# Patient Record
Sex: Female | Born: 1965 | Race: White | Hispanic: No | Smoking: Current every day smoker
Health system: Southern US, Community
[De-identification: ages and names within clinical notes are randomized; demographics above are authoritative.]

## PROBLEM LIST (undated history)

## (undated) DIAGNOSIS — F1010 Alcohol abuse, uncomplicated: Secondary | ICD-10-CM

## (undated) DIAGNOSIS — M5430 Sciatica, unspecified side: Secondary | ICD-10-CM

## (undated) HISTORY — PX: OTHER SURGICAL HISTORY: SHX169

---

## 2000-11-17 ENCOUNTER — Other Ambulatory Visit: Admission: RE | Admit: 2000-11-17 | Discharge: 2000-11-17 | Payer: Self-pay | Admitting: *Deleted

## 2000-11-17 ENCOUNTER — Other Ambulatory Visit: Admission: RE | Admit: 2000-11-17 | Discharge: 2000-11-17 | Payer: Self-pay | Admitting: Family Medicine

## 2000-11-20 ENCOUNTER — Ambulatory Visit (HOSPITAL_COMMUNITY): Admission: RE | Admit: 2000-11-20 | Discharge: 2000-11-20 | Payer: Self-pay | Admitting: Family Medicine

## 2000-11-20 ENCOUNTER — Encounter: Payer: Self-pay | Admitting: Family Medicine

## 2001-01-18 ENCOUNTER — Encounter: Payer: Self-pay | Admitting: Family Medicine

## 2001-01-18 ENCOUNTER — Ambulatory Visit (HOSPITAL_COMMUNITY): Admission: RE | Admit: 2001-01-18 | Discharge: 2001-01-18 | Payer: Self-pay | Admitting: Family Medicine

## 2001-03-15 ENCOUNTER — Inpatient Hospital Stay (HOSPITAL_COMMUNITY): Admission: AD | Admit: 2001-03-15 | Discharge: 2001-03-18 | Payer: Self-pay | Admitting: Family Medicine

## 2001-03-15 ENCOUNTER — Encounter: Payer: Self-pay | Admitting: Family Medicine

## 2001-03-15 ENCOUNTER — Encounter (HOSPITAL_COMMUNITY): Admission: RE | Admit: 2001-03-15 | Discharge: 2001-03-16 | Payer: Self-pay | Admitting: Family Medicine

## 2015-09-10 ENCOUNTER — Encounter (HOSPITAL_COMMUNITY): Payer: Self-pay | Admitting: Emergency Medicine

## 2015-09-10 ENCOUNTER — Inpatient Hospital Stay (HOSPITAL_COMMUNITY)
Admission: EM | Admit: 2015-09-10 | Discharge: 2015-09-14 | DRG: 441 | Disposition: A | Discharge status: Home / Self Care | Payer: Self-pay | Attending: Internal Medicine | Admitting: Internal Medicine

## 2015-09-10 ENCOUNTER — Emergency Department (HOSPITAL_COMMUNITY): Payer: Self-pay

## 2015-09-10 DIAGNOSIS — D539 Nutritional anemia, unspecified: Secondary | ICD-10-CM | POA: Diagnosis present

## 2015-09-10 DIAGNOSIS — K7200 Acute and subacute hepatic failure without coma: Principal | ICD-10-CM | POA: Diagnosis present

## 2015-09-10 DIAGNOSIS — E875 Hyperkalemia: Secondary | ICD-10-CM | POA: Diagnosis present

## 2015-09-10 DIAGNOSIS — F1010 Alcohol abuse, uncomplicated: Secondary | ICD-10-CM

## 2015-09-10 DIAGNOSIS — R52 Pain, unspecified: Secondary | ICD-10-CM

## 2015-09-10 DIAGNOSIS — E876 Hypokalemia: Secondary | ICD-10-CM | POA: Diagnosis present

## 2015-09-10 DIAGNOSIS — D638 Anemia in other chronic diseases classified elsewhere: Secondary | ICD-10-CM | POA: Diagnosis present

## 2015-09-10 DIAGNOSIS — R1909 Other intra-abdominal and pelvic swelling, mass and lump: Secondary | ICD-10-CM

## 2015-09-10 DIAGNOSIS — K769 Liver disease, unspecified: Secondary | ICD-10-CM

## 2015-09-10 DIAGNOSIS — F17210 Nicotine dependence, cigarettes, uncomplicated: Secondary | ICD-10-CM | POA: Diagnosis present

## 2015-09-10 DIAGNOSIS — E871 Hypo-osmolality and hyponatremia: Secondary | ICD-10-CM | POA: Diagnosis present

## 2015-09-10 DIAGNOSIS — K7011 Alcoholic hepatitis with ascites: Secondary | ICD-10-CM | POA: Diagnosis present

## 2015-09-10 DIAGNOSIS — K7460 Unspecified cirrhosis of liver: Secondary | ICD-10-CM | POA: Diagnosis present

## 2015-09-10 DIAGNOSIS — R109 Unspecified abdominal pain: Secondary | ICD-10-CM

## 2015-09-10 DIAGNOSIS — I81 Portal vein thrombosis: Secondary | ICD-10-CM | POA: Diagnosis present

## 2015-09-10 DIAGNOSIS — D689 Coagulation defect, unspecified: Secondary | ICD-10-CM | POA: Diagnosis present

## 2015-09-10 DIAGNOSIS — Z833 Family history of diabetes mellitus: Secondary | ICD-10-CM

## 2015-09-10 DIAGNOSIS — D72829 Elevated white blood cell count, unspecified: Secondary | ICD-10-CM | POA: Diagnosis present

## 2015-09-10 DIAGNOSIS — F1020 Alcohol dependence, uncomplicated: Secondary | ICD-10-CM | POA: Diagnosis present

## 2015-09-10 DIAGNOSIS — Z801 Family history of malignant neoplasm of trachea, bronchus and lung: Secondary | ICD-10-CM

## 2015-09-10 DIAGNOSIS — R17 Unspecified jaundice: Secondary | ICD-10-CM

## 2015-09-10 HISTORY — DX: Alcohol abuse, uncomplicated: F10.10

## 2015-09-10 HISTORY — DX: Sciatica, unspecified side: M54.30

## 2015-09-10 LAB — CBC WITH DIFFERENTIAL/PLATELET
BASOS ABS: 0 10*3/uL (ref 0.0–0.1)
Basophils Relative: 0 %
EOS PCT: 0 %
Eosinophils Absolute: 0.1 10*3/uL (ref 0.0–0.7)
HCT: 32.4 % — ABNORMAL LOW (ref 36.0–46.0)
Hemoglobin: 11.3 g/dL — ABNORMAL LOW (ref 12.0–15.0)
LYMPHS PCT: 9 %
Lymphs Abs: 1.7 10*3/uL (ref 0.7–4.0)
MCH: 38.2 pg — AB (ref 26.0–34.0)
MCHC: 34.9 g/dL (ref 30.0–36.0)
MCV: 109.5 fL — AB (ref 78.0–100.0)
MONO ABS: 1.6 10*3/uL — AB (ref 0.1–1.0)
Monocytes Relative: 9 %
Neutro Abs: 15.3 10*3/uL — ABNORMAL HIGH (ref 1.7–7.7)
Neutrophils Relative %: 82 %
PLATELETS: 165 10*3/uL (ref 150–400)
RBC: 2.96 MIL/uL — ABNORMAL LOW (ref 3.87–5.11)
RDW: 16.1 % — AB (ref 11.5–15.5)
WBC: 18.6 10*3/uL — ABNORMAL HIGH (ref 4.0–10.5)

## 2015-09-10 LAB — URINALYSIS, ROUTINE W REFLEX MICROSCOPIC
Glucose, UA: 100 mg/dL — AB
Ketones, ur: 15 mg/dL — AB
NITRITE: POSITIVE — AB
PH: 6.5 (ref 5.0–8.0)
Protein, ur: 100 mg/dL — AB
Specific Gravity, Urine: 1.025 (ref 1.005–1.030)

## 2015-09-10 LAB — PHOSPHORUS: PHOSPHORUS: 1.7 mg/dL — AB (ref 2.5–4.6)

## 2015-09-10 LAB — BASIC METABOLIC PANEL
Anion gap: 12 (ref 5–15)
CHLORIDE: 93 mmol/L — AB (ref 101–111)
CO2: 26 mmol/L (ref 22–32)
CREATININE: 0.39 mg/dL — AB (ref 0.44–1.00)
Calcium: 8.2 mg/dL — ABNORMAL LOW (ref 8.9–10.3)
GFR calc Af Amer: >60 mL/min (ref >60)
GFR calc non Af Amer: >60 mL/min (ref >60)
Glucose, Bld: 105 mg/dL — ABNORMAL HIGH (ref 65–99)
POTASSIUM: 3.5 mmol/L (ref 3.5–5.1)
SODIUM: 131 mmol/L — AB (ref 135–145)

## 2015-09-10 LAB — RAPID URINE DRUG SCREEN, HOSP PERFORMED
Amphetamines: NOT DETECTED
BARBITURATES: NOT DETECTED
Benzodiazepines: NOT DETECTED
Cocaine: NOT DETECTED
Opiates: NOT DETECTED
Tetrahydrocannabinol: POSITIVE — AB

## 2015-09-10 LAB — URINE MICROSCOPIC-ADD ON

## 2015-09-10 LAB — PROTIME-INR
INR: 2.05 — AB (ref 0.00–1.49)
PROTHROMBIN TIME: 23 s — AB (ref 11.6–15.2)

## 2015-09-10 LAB — HEPATIC FUNCTION PANEL
ALBUMIN: 2.1 g/dL — AB (ref 3.5–5.0)
ALK PHOS: 254 U/L — AB (ref 38–126)
ALT: 75 U/L — AB (ref 14–54)
AST: 178 U/L — AB (ref 15–41)
BILIRUBIN TOTAL: 18 mg/dL — AB (ref 0.3–1.2)
Bilirubin, Direct: 10.1 mg/dL — ABNORMAL HIGH (ref 0.1–0.5)
Indirect Bilirubin: 7.9 mg/dL — ABNORMAL HIGH (ref 0.3–0.9)
TOTAL PROTEIN: 6.9 g/dL (ref 6.5–8.1)

## 2015-09-10 LAB — LIPASE, BLOOD: Lipase: 25 U/L (ref 11–51)

## 2015-09-10 LAB — APTT: aPTT: 42 seconds — ABNORMAL HIGH (ref 24–37)

## 2015-09-10 LAB — MAGNESIUM: MAGNESIUM: 1.2 mg/dL — AB (ref 1.7–2.4)

## 2015-09-10 MED ORDER — VITAMIN B-1 100 MG PO TABS
100.0000 mg | ORAL_TABLET | Freq: Every day | ORAL | Status: DC
  Administered 2015-09-11 – 2015-09-14 (×4): 100 mg via ORAL
  Filled 2015-09-10 (×5): qty 1

## 2015-09-10 MED ORDER — ONDANSETRON HCL 4 MG/2ML IJ SOLN: 4.0000 mg | Freq: Four times a day (QID) | INTRAMUSCULAR | Status: DC | PRN

## 2015-09-10 MED ORDER — FOLIC ACID 1 MG PO TABS
1.0000 mg | ORAL_TABLET | Freq: Every day | ORAL | Status: DC
  Administered 2015-09-11 – 2015-09-14 (×4): 1 mg via ORAL
  Filled 2015-09-10 (×5): qty 1

## 2015-09-10 MED ORDER — DEXTROSE-NACL 5-0.9 % IV SOLN
INTRAVENOUS | Status: DC
  Administered 2015-09-11 (×2): via INTRAVENOUS

## 2015-09-10 MED ORDER — ADULT MULTIVITAMIN W/MINERALS CH
1.0000 | ORAL_TABLET | Freq: Every day | ORAL | Status: DC
  Administered 2015-09-11 – 2015-09-14 (×4): 1 via ORAL
  Filled 2015-09-10 (×5): qty 1

## 2015-09-10 MED ORDER — ONDANSETRON HCL 4 MG PO TABS: 4.0000 mg | ORAL_TABLET | Freq: Four times a day (QID) | ORAL | Status: DC | PRN

## 2015-09-10 MED ORDER — LORAZEPAM 1 MG PO TABS
1.0000 mg | ORAL_TABLET | Freq: Four times a day (QID) | ORAL | Status: AC | PRN
Start: 2015-09-10 — End: 2015-09-13

## 2015-09-10 MED ORDER — LORAZEPAM 2 MG/ML IJ SOLN: 1.0000 mg | Freq: Four times a day (QID) | INTRAMUSCULAR | Status: AC | PRN

## 2015-09-10 MED ORDER — THIAMINE HCL 100 MG/ML IJ SOLN: 100.0000 mg | Freq: Every day | INTRAMUSCULAR | Status: DC

## 2015-09-10 MED ORDER — ENOXAPARIN SODIUM 40 MG/0.4ML ~~LOC~~ SOLN
40.0000 mg | SUBCUTANEOUS | Status: DC
  Administered 2015-09-11 – 2015-09-13 (×4): 40 mg via SUBCUTANEOUS
  Filled 2015-09-10 (×4): qty 0.4

## 2015-09-10 NOTE — H&P (Signed)
[facility] History and Physical  ARMA REINING TWK:[id number] DOB: 1965/11/23 DOA: 09/10/2015  Referring physician: Dr Stark Jock - [facility] PCP: No primary care provider on file.   Chief Complaint: yellow skin  HPI: Kiara Warren is a 49 y.o. female  Jaundice. Patient reports yellowing of her skin which started several days ago. Constant. Getting worse. Never been evaluated for this until now. Associate with abdominal distention and abdominal discomfort and swelling in her legs. States that she's been a heavy drinker for several years which is gotten worse over the last 5 years. She had drinks at least a fifth of vodka per day as baseline. Denies any fevers, chest pain, palpitations, shortness of breath, headache, rash. Intermittent abdominal pain that is generalized and described as "1 million pulled muscles,".    Review of Systems:  Constitutional:  No weight loss, night sweats, Fevers, chills,  HEENT:  No headaches, Difficulty swallowing,Tooth/dental problems,Sore throat, Cardio-vascular:  No chest pain, Orthopnea, anasarca, dizziness, palpitations  GI: Per HPI Resp:   No shortness of breath with exertion or at rest. No excess mucus, no productive cough, No non-productive cough, No coughing up of blood.No change in color of mucus.No wheezing.No chest wall deformity  Skin: Jaundiced. Rash around upper chest resolving from wearing cheap necklace no rash or lesions.  GU:  no dysuria, change in color of urine, no urgency or frequency. No flank pain.  Musculoskeletal:   No joint pain or swelling. No decreased range of motion. No back pain.  Psych:  No change in mood or affect. No depression or anxiety. No memory loss.  Neuro:  No change in sensation, unilateral strength, or cognitive abilities  All other systems were reviewed and are negative.  Past Medical History  Diagnosis Date  . ETOH abuse   . Sciatica    History reviewed. No pertinent past surgical  history. Social History:  reports that she has been smoking Cigarettes.  She has been smoking about 2.00 packs per day. She does not have any smokeless tobacco history on file. She reports that she drinks alcohol. She reports that she uses illicit drugs (Marijuana).  No Known Allergies  Family History  Problem Relation Age of Onset  . Cancer - Lung Father   . Cholecystitis Mother   . Diabetes Father      Prior to Admission medications   Not on File   Physical Exam: Filed Vitals:   09/10/15 2045 09/10/15 2100 09/10/15 2100 09/10/15 2115  BP:  114/75 114/75   Pulse: 114 115  118  Temp:      TempSrc:      Resp:      Height:      Weight:      SpO2: 95% 95%  95%    Wt Readings from Last 3 Encounters:  09/10/15 82.963 kg (182 lb 14.4 oz)    General:  Appears calm and comfortable Eyes: scleral icterus, PERRL, EOMI, ENT:  grossly normal hearing, lips & tongue Neck:  no LAD, masses or thyromegaly Cardiovascular:  RRR, no m/r/g. Trace bilat Le pitting edema Respiratory:  CTA bilaterally, no w/r/r. Normal respiratory effort. Abdomen: Distended, normoactive bowel sounds, intermittently tender with no focal tenderness. Nontender McBurney's point, negative Murphy sign Skin: Jaundiced throughout, mild scaling and erythema of the upper chest and Route neck. Musculoskeletal:  grossly normal tone BUE/BLE Psychiatric:  grossly normal mood and affect, speech fluent and appropriate Neurologic:  CN 2-12 grossly intact, moves all extremities in coordinated fashion.  Labs on Admission:  Basic Metabolic Panel:  Recent Labs Lab 09/10/15 2005  NA 131*  K 3.5  CL 93*  CO2 26  GLUCOSE 105*  BUN <5*  CREATININE 0.39*  CALCIUM 8.2*   Liver Function Tests:  Recent Labs Lab 09/10/15 2005  AST 178*  ALT 75*  ALKPHOS 254*  BILITOT 18.0*  PROT 6.9  ALBUMIN 2.1*    Recent Labs Lab 09/10/15 2005  LIPASE 25   No results for input(s): AMMONIA in the last 168  hours. CBC:  Recent Labs Lab 09/10/15 2005  WBC 18.6*  NEUTROABS 15.3*  HGB 11.3*  HCT 32.4*  MCV 109.5*  PLT 165   Cardiac Enzymes: No results for input(s): CKTOTAL, CKMB, CKMBINDEX, TROPONINI in the last 168 hours.  BNP (last 3 results) No results for input(s): BNP in the last 8760 hours.  ProBNP (last 3 results) No results for input(s): PROBNP in the last 8760 hours.   CREATININE: 0.39 mg/dL ABNORMAL (09/10/15 2005) Estimated creatinine clearance - 95.3 mL/min  CBG: No results for input(s): GLUCAP in the last 168 hours.  Radiological Exams on Admission: Dg Chest 2 View  09/10/2015  CLINICAL DATA:  49 year old female with jaundice and abdominal pain and swelling. EXAM: CHEST  2 VIEW COMPARISON:  None. FINDINGS: The cardiomediastinal silhouette is unremarkable. There is no evidence of focal airspace disease, pulmonary edema, suspicious pulmonary nodule/mass, pleural effusion, or pneumothorax. No acute bony abnormalities are identified. IMPRESSION: No active cardiopulmonary disease. Electronically Signed   By: Margarette Canada M.D.   On: 09/10/2015 18:46      Assessment/Plan Principal Problem:   Acute liver failure Active Problems:   Jaundice   Hyperbilirubinemia   Abdominal pain   ETOH abuse   Leukocytosis   Acute liver failure: suspect cirrhosis secondary to ETOH abuse, but cannot other etiologies at this time. Patient with recent viral GI infection, intermittent drug use (THC), and h/o cholecystitis. NA 131, lipase 25, AST 178, ALT 75, Alk phos 254, T bili 10.1, indirect 7.9, total bili 18, WBC 18.6, hemoglobin 11, PLT 165. MELD score ~33 (assuming nml INR) - Tele - GI consult in am - CIWA - Hepatitis panel - ABD [place] - Coags -  D5 NS IVF (vitamins provided) - Mag, Phos, lipids, TSH, A1c  Abd pain: Likely secondary to distension from hepatitis/cirrhosis. Cannot exclude SBP but unlikely given pt is AFVSS, and appears very well. WBC 18 is concerning though  (Neutrophils nml, Lymph elevated).  - ABD [place] as above - Paracentecis if fluid present - Start ABX if deteriorates.   ETOH abuse: heavy daily drinker for years. Drinks a 5th of Vodka daily at baseline. Cessation counseling provided at time of admission - CIWA - CSW for community resources - UDS (endorses [facility])  Code Status: FULL  DVT Prophylaxis: Lovenox Family Communication: Boyfriend Disposition Plan: Pending Improvement    Jaquari Reckner Lenna Sciara, MD Family Medicine [facility] [email or web address] Password [email or web address]

## 2015-09-10 NOTE — ED Notes (Signed)
Pt states that she has been a heavy drinker all of her life and now has noticed jaundice developing over the past few days with abdominal swelling and ankle swelling.  Also c/o abdominal pain.  Has never been dx with liver issues before.

## 2015-09-10 NOTE — ED Provider Notes (Signed)
CSN: [id number]     Arrival date & time 09/10/15  1803 History   First MD Initiated Contact with Patient 09/10/15 1925     Chief Complaint  Patient presents with  . Jaundice     (Consider location/radiation/quality/duration/timing/severity/associated sxs/prior Treatment) HPI Comments: Patient is a 49 year old female with no significant past medical history. She presents for evaluation of jaundiced skin. She reports being a daily drinker for many years. She states over the past for 5 years her drinking has escalated to a fifth of vodka per day. Several weeks ago she had what she thought was a stomach virus. She had vomiting and diarrhea which has since resolved. She noticed several days ago feeling tired and that her skin was yellow and presents for evaluation of this. She also reports abdominal distention and discomfort and swelling in her legs.  The history is provided by the patient.    History reviewed. No pertinent past medical history. History reviewed. No pertinent past surgical history. History reviewed. No pertinent family history. Social History  Substance Use Topics  . Smoking status: Current Every Day Smoker -- 2.00 packs/day    Types: Cigarettes  . Smokeless tobacco: None  . Alcohol Use: Yes     Comment: drinks about a fifth of vodka per day.    OB History    No data available     Review of Systems  All other systems reviewed and are negative.     Allergies  Review of patient's allergies indicates no known allergies.  Home Medications   Prior to Admission medications   Not on File   BP 143/79 mmHg  Pulse 118  Temp(Src) 98.3 F (36.8 C) (Oral)  Resp 18  Ht  (1.702 m)  Wt 182 lb 14.4 oz (82.963 kg)  BMI 28.64 kg/m2  SpO2 100% Physical Exam  Constitutional: She is oriented to person, place, and time. She appears well-developed and well-nourished. No distress.  HENT:  Head: Normocephalic and atraumatic.  Eyes: EOM are normal. Pupils are equal,  round, and reactive to light. Scleral icterus is present.  Neck: Normal range of motion. Neck supple.  Cardiovascular: Normal rate and regular rhythm.  Exam reveals no gallop and no friction rub.   No murmur heard. Pulmonary/Chest: Effort normal and breath sounds normal. No respiratory distress. She has no wheezes.  Abdominal: Soft. Bowel sounds are normal. She exhibits no distension. There is no tenderness.  Musculoskeletal: Normal range of motion. She exhibits edema.  There is 1+ pitting edema of the bilateral lower extremities.  Neurological: She is alert and oriented to person, place, and time. No cranial nerve deficit. Coordination normal.  Skin: Skin is warm and dry. She is not diaphoretic.  There is jaundice present.  Nursing note and vitals reviewed.   ED Course  Procedures (including critical care time) Labs Review Labs Reviewed  CBC WITH DIFFERENTIAL/PLATELET  LIPASE, BLOOD  URINALYSIS, ROUTINE W REFLEX MICROSCOPIC (NOT AT [facility])  HEPATITIS PANEL, ACUTE  HEPATIC FUNCTION PANEL  BASIC METABOLIC PANEL    Imaging Review Dg Chest 2 View  09/10/2015  CLINICAL DATA:  49 year old female with jaundice and abdominal pain and swelling. EXAM: CHEST  2 VIEW COMPARISON:  None. FINDINGS: The cardiomediastinal silhouette is unremarkable. There is no evidence of focal airspace disease, pulmonary edema, suspicious pulmonary nodule/mass, pleural effusion, or pneumothorax. No acute bony abnormalities are identified. IMPRESSION: No active cardiopulmonary disease. [id number] Signed   By: Harmon Pier M.D.   On: 09/10/2015 18:46  I have personally reviewed and evaluated these images and lab results as part of my medical decision-making.   EKG Interpretation None      MDM   Final diagnoses:  None    Patient is a 49 year old female with history of chronic alcoholism. She presents with complaints of yellowness to her skin, abdominal distention, and swelling in her legs. Her workup  reveals a direct hyperbilirubinemia with elevations of her LFTs. She also appears to have some ascites on exam. She will be admitted to the hospitalist service for further workup and GI consultation.    Geoffery Lyonsouglas Myrtie Leuthold, MD 09/10/15 2107

## 2015-09-11 ENCOUNTER — Encounter (HOSPITAL_COMMUNITY): Payer: Self-pay | Admitting: Gastroenterology

## 2015-09-11 ENCOUNTER — Inpatient Hospital Stay (HOSPITAL_COMMUNITY): Payer: MEDICAID

## 2015-09-11 ENCOUNTER — Inpatient Hospital Stay (HOSPITAL_COMMUNITY): Payer: Self-pay

## 2015-09-11 DIAGNOSIS — D689 Coagulation defect, unspecified: Secondary | ICD-10-CM

## 2015-09-11 DIAGNOSIS — K7200 Acute and subacute hepatic failure without coma: Principal | ICD-10-CM

## 2015-09-11 LAB — TSH: TSH: 2.089 u[IU]/mL (ref 0.350–4.500)

## 2015-09-11 LAB — COMPREHENSIVE METABOLIC PANEL
ALT: 59 U/L — AB (ref 14–54)
AST: 137 U/L — AB (ref 15–41)
Albumin: 1.7 g/dL — ABNORMAL LOW (ref 3.5–5.0)
Alkaline Phosphatase: 203 U/L — ABNORMAL HIGH (ref 38–126)
Anion gap: 9 (ref 5–15)
BUN: 5 mg/dL — AB (ref 6–20)
CHLORIDE: 95 mmol/L — AB (ref 101–111)
CO2: 28 mmol/L (ref 22–32)
CREATININE: 0.47 mg/dL (ref 0.44–1.00)
Calcium: 7.9 mg/dL — ABNORMAL LOW (ref 8.9–10.3)
GFR calc Af Amer: >60 mL/min (ref >60)
GFR calc non Af Amer: >60 mL/min (ref >60)
Glucose, Bld: 118 mg/dL — ABNORMAL HIGH (ref 65–99)
Potassium: 3.4 mmol/L — ABNORMAL LOW (ref 3.5–5.1)
SODIUM: 132 mmol/L — AB (ref 135–145)
Total Bilirubin: 15.8 mg/dL — ABNORMAL HIGH (ref 0.3–1.2)
Total Protein: 5.7 g/dL — ABNORMAL LOW (ref 6.5–8.1)

## 2015-09-11 LAB — IRON AND TIBC
Iron: 94 ug/dL (ref 28–170)
SATURATION RATIOS: 95 % — AB (ref 10.4–31.8)
TIBC: 99 ug/dL — ABNORMAL LOW (ref 250–450)
UIBC: 5 ug/dL

## 2015-09-11 LAB — CBC
HCT: 26.8 % — ABNORMAL LOW (ref 36.0–46.0)
HEMOGLOBIN: 9.5 g/dL — AB (ref 12.0–15.0)
MCH: 38.6 pg — ABNORMAL HIGH (ref 26.0–34.0)
MCHC: 35.4 g/dL (ref 30.0–36.0)
MCV: 108.9 fL — ABNORMAL HIGH (ref 78.0–100.0)
PLATELETS: 131 10*3/uL — AB (ref 150–400)
RBC: 2.46 MIL/uL — ABNORMAL LOW (ref 3.87–5.11)
RDW: 16.3 % — ABNORMAL HIGH (ref 11.5–15.5)
WBC: 13.5 10*3/uL — ABNORMAL HIGH (ref 4.0–10.5)

## 2015-09-11 LAB — PROTIME-INR
INR: 1.96 — ABNORMAL HIGH (ref 0.00–1.49)
Prothrombin Time: 22.2 seconds — ABNORMAL HIGH (ref 11.6–15.2)

## 2015-09-11 LAB — VITAMIN B12: Vitamin B-12: 4665 pg/mL — ABNORMAL HIGH (ref 180–914)

## 2015-09-11 LAB — FERRITIN: FERRITIN: 887 ng/mL — AB (ref 11–307)

## 2015-09-11 LAB — ACETAMINOPHEN LEVEL: Acetaminophen (Tylenol), Serum: <10 ug/mL — ABNORMAL LOW (ref 10–30)

## 2015-09-11 MED ORDER — PHYTONADIONE 5 MG PO TABS
5.0000 mg | ORAL_TABLET | Freq: Once | ORAL | Status: AC
  Administered 2015-09-11: 5 mg via ORAL
  Filled 2015-09-11: qty 1

## 2015-09-11 NOTE — Care Management Note (Signed)
Case Management Note  Patient Details  Name: Kiara Warren MRN: [id number] Date of Birth: 06/03/1966  Subjective/Objective:                  Pt is from home, lives with boyfriend and independent with ADL's.  Pt verified she is uninsured and then asked CM to leave room as she was tired and did not wish to complete assessment.   [facility]: [facility] is also following for ETOH abuse. Pt may need MATCH voucher at DC. Pt will f/u with GI but may also need PCP. Will cont to follow and reassess at later time.    Expected Discharge Date:    09/12/2015              Expected Discharge Plan:  Home/Self Care  In-House Referral:  Financial Counselor  Discharge planning Services  CM Consult  Post Acute Care Choice:  NA Choice offered to:  NA  DME Arranged:    DME Agency:     HH Arranged:    HH Agency:     Status of Service:  In process, will continue to follow  Medicare Important Message Given:    Date Medicare IM Given:    Medicare IM give by:    Date Additional Medicare IM Given:    Additional Medicare Important Message give by:     If discussed at Long Length of Stay Meetings, dates discussed:    Additional Comments:  Malcolm MetroChildress, Kiara Saffo Demske, RN 09/11/2015, 11:57 AM

## 2015-09-11 NOTE — Progress Notes (Signed)
Sunday SpillersAna Sams, NP contacted regarding ultrasound results. No new orders received at this time. Will continue to monitor. Asher Muir- Mikya Don,RN

## 2015-09-11 NOTE — Clinical Social Work Note (Signed)
Clinical Social Work Assessment  Patient Details  Name: MIHAELA FAJARDO MRN: [id number] Date of Birth: 1966/09/21  Date of referral:  09/11/15               Reason for consult:  Substance Use/ETOH Abuse                Permission sought to share information with:    Permission granted to share information::     Name::        Agency::     Relationship::     Contact Information:     Housing/Transportation Living arrangements for the past 2 months:  Single Family Home Source of Information:  Patient Patient Interpreter Needed:  None Criminal Activity/Legal Involvement Pertinent to Current Situation/Hospitalization:  No - Comment as needed Significant Relationships:  Significant Other Lives with:  Significant Other Do you feel safe going back to the place where you live?  Yes Need for family participation in patient care:  No (Coment)  Care giving concerns:  Pt is independent.    Social Worker assessment / plan:  CSW met with pt at bedside following referral for substance use. Pt alert and oriented, but almost frustrated at times during assessment with questions. She states she has been working part time and will be living with her boyfriend when she leaves the hospital. When asked about support, pt just replied, "That's a loaded question" and did not answer. She agreed to discuss substance use, but states, "There's not much to say."   Pt admits to drinking daily. 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 About 4-5 years ago she started drinking vodka and this was her "downfall." Pt now drinks about a fifth of vodka a day. She admits this is a problem for her. Pt has been to [place] in the past, but didn't find it very helpful. She appeared to be open to outpatient treatment, but said that her main deterrent is her boyfriend. She states, "He will be watching me like a hawk." He has been asking pt for years to stop. Pt also admits to occasional marijuana use.  She reports this is for sciatica pain and does not feel this is a problem. Referrals given to pt for outpatient follow up. CSW will sign off, but can be reconsulted if needed.    Employment status:  Systems developer information:  Self Pay (Medicaid Pending) PT Recommendations:  Not assessed at this time Information / Referral to community resources:  Outpatient Substance Abuse Treatment Options  Patient/Family's Response to care:  Pt reports she has to stop drinking. She reports people will be closely watching her to make sure she stays sober. Pt accepted substance abuse referrals. SBIRT completed and pt scored 21.   Patient/Family's Understanding of and Emotional Response to Diagnosis, Current Treatment, and Prognosis:  Pt indicates she is well aware of the effects of her drinking and is disgusted with herself for continuing. She states she has no choice now, she has to stop, but acknowledges that this is easier to say when she feels bad.   Emotional Assessment Appearance:  Appears stated age Attitude/Demeanor/Rapport:  Guarded Affect (typically observed):  Frustrated Orientation:  Oriented to Self, Oriented to Place, Oriented to  Time, Oriented to Situation Alcohol / Substance use:  Illicit Drugs, Alcohol Use Psych involvement (Current and /or in the community):  No (Comment)  Discharge Needs  Concerns to be addressed:    Readmission within  the last 30 days:  No Current discharge risk:  Substance Abuse Barriers to Discharge:  Continued Medical Work up   [employer], [place] 09/11/2015, 9:15 AM [id number]

## 2015-09-11 NOTE — Consult Note (Signed)
Referring Provider: Dr. Konrad Dolores Primary Care Physician:  No primary care provider on file. Primary Gastroenterologist:  Dr. Darrick Penna   Date of Admission: 09/10/15 Date of Consultation: 09/11/15  Reason for Consultation:  Jaundice, elevated LFTs  HPI:  Kiara Warren is a 49 y.o. year old female with history of chronic alcohol abuse, presenting with jaundice starting several days ago.   About 2 weeks ago  had a lot of diarrhea, thought she had a virus. Some vomiting. Started noticing feet/ankle edema, abdominal distension/swelling. Abdomen feels sore, feels like it is full of "pulled muscles". No confusion. Feels exhausted. Somewhat irritated about having to tell her history again. Drinks 1/5 of vodka for about 4-5 years. Has slowly progressed in quantity over the years. Marijuana. No IV drug use currently or in past. 1 tattoo on foot. No blood transfusions. Poor appetite. Can't eat anything heavy. Has been living on V8 for days.   Drug screen with positive marijuana. Viral markers pending. On admission with markedly abnormal mixed pattern LFTs. INR 2.05. Acetaminophen level normal. US abdomen with question of portal vein thrombosis. DF 54-70.   Past Medical History  Diagnosis Date  . ETOH abuse   . Sciatica     Past Surgical History  Procedure Laterality Date  . None      Prior to Admission medications   Not on File    Current Facility-Administered Medications  Medication Dose Route Frequency Provider Last Rate Last Dose  . dextrose 5 %-0.9 % sodium chloride infusion   Intravenous Continuous Ozella Rocks, MD 100 mL/hr at 09/11/15 0040    . enoxaparin (LOVENOX) injection 40 mg  40 mg Subcutaneous Q24H Ozella Rocks, MD   40 mg at 09/11/15 0039  . folic acid (FOLVITE) tablet 1 mg  1 mg Oral Daily Ozella Rocks, MD      . LORazepam (ATIVAN) tablet 1 mg  1 mg Oral Q6H PRN Ozella Rocks, MD       Or  . LORazepam (ATIVAN) injection 1 mg  1 mg Intravenous Q6H PRN  Ozella Rocks, MD      . multivitamin with minerals tablet 1 tablet  1 tablet Oral Daily Ozella Rocks, MD      . ondansetron [facility]) tablet 4 mg  4 mg Oral Q6H PRN Ozella Rocks, MD       Or  . ondansetron [facility]) injection 4 mg  4 mg Intravenous Q6H PRN Ozella Rocks, MD      . thiamine (VITAMIN B-1) tablet 100 mg  100 mg Oral Daily Ozella Rocks, MD       Or  . thiamine (B-1) injection 100 mg  100 mg Intravenous Daily Ozella Rocks, MD        Allergies as of 09/10/2015  . (No Known Allergies)    Family History  Problem Relation Age of Onset  . Cancer - Lung Father   . Cholecystitis Mother   . Diabetes Father   . Colon cancer Neg Hx     Social History   Social History  . Marital Status: Legally Separated    Spouse Name: N/A  . Number of Children: N/A  . Years of Education: N/A   Occupational History  . part-time     [employer]   Social History Main Topics  . Smoking status: Current Every Day Smoker -- 2.00 packs/day    Types: Cigarettes  . Smokeless tobacco: Not on file  .  Alcohol Use: 0.0 oz/week    0 Standard drinks or equivalent per week     Comment: drinks about a fifth of vodka per day.   . Drug Use: Yes    Special: Marijuana     Comment: occasional  . Sexual Activity: Yes   Other Topics Concern  . Not on file   Social History Narrative    Review of Systems: Gen: see HPI  CV: Denies chest pain, heart palpitations, syncope, edema  Resp: , +DOE, SOB/cough GI: see HPI  GU : Denies urinary burning, urinary frequency, urinary incontinence.  MS: see HPI  Derm: Denies rash, itching, dry skin Psych: +depression Heme: Denies bruising, bleeding, and enlarged lymph nodes.  Physical Exam: Vital signs in last 24 hours: Temp:  [98.3 F (36.8 C)-98.9 F (37.2 C)] 98.9 F (37.2 C) (11/29 1610) Pulse Rate:  [93-121] 93 (11/29 0611) Resp:  [17-33] 20 (11/29 0611) BP: (101-143)/(69-79) 101/69 mmHg (11/29 0611) SpO2:  [91 %-100 %] 96 % (11/29  0611) Weight:  [182 lb 14.4 oz (82.963 kg)-184 lb (83.462 kg)] 184 lb (83.462 kg) (11/29 0010) Last BM Date: 09/10/15 General:   Alert, jaundiced, oriented Head:  Normocephalic and atraumatic. Eyes:  +scleral icterus  Ears:  Normal auditory acuity. Nose:  No deformity, discharge,  or lesions. Mouth:  No deformity or lesions, dentition normal. Lungs:  Clear throughout to auscultation.   No wheezes, crackles, or rhonchi. No acute distress. Heart:  S1 S2 present without murmurs  Abdomen:  Distended but soft. Mildly TTP diffusely. Difficult to appreciate any HSM due to larger AP diameter. +BS.  Rectal:  Deferred until time of colonoscopy.   Msk:  Symmetrical without gross deformities. Normal posture. Extremities:  Without edema. Neurologic:  Alert and  oriented x4;  grossly normal neurologically. Skin:  Jaundiced Psych:  Alert and cooperative. Normal mood and affect.  Intake/Output from previous day:   Intake/Output this shift:    Lab Results:  Recent Labs  09/10/15 2005 09/11/15 0531  WBC 18.6* 13.5*  HGB 11.3* 9.5*  HCT 32.4* 26.8*  PLT 165 131*   BMET  Recent Labs  09/10/15 2005 09/11/15 0531  NA 131* 132*  K 3.5 3.4*  CL 93* 95*  CO2 26 28  GLUCOSE 105* 118*  BUN <5* 5*  CREATININE 0.39* 0.47  CALCIUM 8.2* 7.9*   LFT  Recent Labs  09/10/15 2005 09/11/15 0531  PROT 6.9 5.7*  ALBUMIN 2.1* 1.7*  AST 178* 137*  ALT 75* 59*  ALKPHOS 254* 203*  BILITOT 18.0* 15.8*  BILIDIR 10.1*  --   IBILI 7.9*  --    PT/INR  Recent Labs  09/10/15 2005  LABPROT 23.0*  INR 2.05*   Studies/Results: Dg Chest 2 View  09/10/2015  CLINICAL DATA:  49 year old female with jaundice and abdominal pain and swelling. EXAM: CHEST  2 VIEW COMPARISON:  None. FINDINGS: The cardiomediastinal silhouette is unremarkable. There is no evidence of focal airspace disease, pulmonary edema, suspicious pulmonary nodule/mass, pleural effusion, or pneumothorax. No acute bony abnormalities  are identified. IMPRESSION: No active cardiopulmonary disease. Electronically Signed   By: Harmon Pier M.D.   On: 09/10/2015 18:46   US Abdomen Limited Ruq  09/11/2015  CLINICAL DATA:  Hepatic failure with jaundice. EXAM: US ABDOMEN LIMITED - RIGHT UPPER QUADRANT COMPARISON:  None. FINDINGS: Gallbladder: Sludge is seen in the gallbladder. No gallstones are appreciable. The gallbladder wall is thickened and edematous. There is no well-defined pericholecystic fluid. No sonographic Murphy sign noted.  Common bile duct: Diameter: 3 mm. There is no intrahepatic or extrahepatic biliary duct dilatation. Liver: No focal lesion identified. Liver is diffusely echogenic. There is attenuated flow in the portal vein with subtle echogenic material in the main portal vein. A small amount of ascites is noted. IMPRESSION: The wall of the gallbladder is thickened and edematous. While ascites, which is present, can cause gallbladder wall thickening, the edematous appearance of the gallbladder raises concern for acalculus cholecystitis. No gallstones are seen; however, sludge is noted in the gallbladder. Liver shows diffuse increased echogenicity consistent with hepatic steatosis, likely with underlying parenchymal liver disease given the history. Question portal vein thrombosis. Doppler signal is attenuated in the main portal vein with subtle echogenic material concerning for thrombus in this vessel. Small amount of ascites. Electronically Signed   By: Bretta BangWilliam  Woodruff III M.D.   On: 09/11/2015 08:51    Impression: 49 year old female admitted with likely acute alcoholic hepatitis, highly suspect underlying cirrhosis, acute liver failure. Mild improvement in LFTs from admission, with DF ranging from 54-70. Need to also rule out any other etiologies such as viral hepatitis, autoimmune/PBC. Will need prednisolone once documented negative Hep C antibody. No obvious signs/symptoms of infection and leukocytosis is improving.  Question reactive. Small amount of ascites on US but doubt SBP. Needs duplex ultrasound of portal vein to assess for possible thrombosis. Discussed at length with patient the concern for mortality if persistent ETOH use. May benefit from Vit K. Will recheck INR now along with further hepatic serologies.   Plan: INR, ASMA, AMA, ANA, ceruloplasmin, ferritin, iron and TIBC  Doppler ultrasound of portal vein May need Vit K Follow-up on hepatitis panel: will need prednisolone 40 mg for 30 days with rapid taper thereafter May have full liquids Absolute ETOH cessation Recommend elastography in future as outpatient   Nira RetortAnna W. Sams, ANP-BC [facility]     LOS: 1 day    09/11/2015, 10:01 AM    Addendum at 1510: portal vein thrombosis as expected on duplex ultrasound. Will consult hematology. Favor acute thrombosis vs chronic but indeterminate on imaging.

## 2015-09-11 NOTE — Progress Notes (Signed)
PROGRESS NOTE  Kiara Warren:[id number] DOB: 1966-08-11 DOA: 09/10/2015 PCP: No primary care provider on file.  HPI/Recap of past 24 hours: Patient is a 49 year old female with past medical history of gallstones and secondary cholecystitis, although she still has her gallbladder and heavy alcohol use (she drinks about a fifth of vodka daily for the last 5 years)  admitted on 11/28 coming in for abdominal distention and iscomfort plus jaundice.  In the emergency room, patient found to have total bilirubin of 18, white blood cell count of 18, and elevated INR, consistent with liver cirrhosis, likely secondary to heavy alcohol use. An abdominal ultrasound was done which only noted a small amount of ascites and patient not felt to be in bacterial peritonitis. Ultrasound did not a questionable portal vein thrombosis. Seen by GI and a vascular hepatic ultrasound was done noting thrombosis of the proximal middle portion of portal vein of indeterminate age.   Patient seen this morning. She complains of some abdominal discomfort when she tries to move. She describes it as all across her abdomen with no radiation to the back. She denies any association with food. When confronted that she was in liver failure, she is understandably upset and is willing to accept that her drinking has been the cause of this. She states that she needs to immediately stop drinking because she does not want to die.  Assessment/Plan: Principal Problem:   Acute liver failure with secondary hyperbilirubinemia, coagulopathy and abdominal pain. Pain likely secondary to abdominal distention from mild ascites. Appreciate GI assistance. We'll discuss with them findings of vascular ultrasound. Patient will be given resources for alcohol cessation and she does seem to be motivated at least at this time to quit. We'll give vitamin K for coagulopathy. Active Problems:    ETOH abuse: Extensive discussion about drinking cessation.  Patient's last drink was several shots of care 2 days ago. No signs of active withdrawal yet. Continue to monitor. CIWA protocol.    Leukocytosis: Looks to be stress margination? No signs of active infection. No peritonitis. White count is improved down to 13 today without antibiotics.   Code Status: for code    Family Communication: mom at the bedside   Disposition Plan: anticipate possible discharge tomorrow as long as she does not: Active withdrawals    Consultants:  Gastroenterology   Procedures:    none   Antibiotics:  none   Objective: BP 106/69 mmHg  Pulse 89  Temp(Src) 98.6 F (37 C) (Oral)  Resp 20  Ht  (1.702 m)  Wt 83.462 kg (184 lb)  BMI 28.81 kg/m2  SpO2 95% No intake or output data in the 24 hours ending 09/11/15 1626 Filed Weights   09/10/15 1812 09/11/15 0010  Weight: 82.963 kg (182 lb 14.4 oz) 83.462 kg (184 lb)    Exam:   General:  alert and oriented 3, mild distress secondary to diagnosis, jaundiced  Cardiovascular: Regular rate and rhythm, S1 and S2    Respiratory: clear to auscultation bilaterally   Abdomen: soft, mild distention, nonspecific tenderness, hypoactive bowel sounds   Musculoskeletal: Trace pitting edema bilaterally    Data Reviewed: Basic Metabolic Panel:  Recent Labs Lab 09/10/15 2005 09/11/15 0531  NA 131* 132*  K 3.5 3.4*  CL 93* 95*  CO2 26 28  GLUCOSE 105* 118*  BUN <5* 5*  CREATININE 0.39* 0.47  CALCIUM 8.2* 7.9*  MG 1.2*  --   PHOS 1.7*  --    Liver Function  Tests:  Recent Labs Lab 09/10/15 2005 09/11/15 0531  AST 178* 137*  ALT 75* 59*  ALKPHOS 254* 203*  BILITOT 18.0* 15.8*  PROT 6.9 5.7*  ALBUMIN 2.1* 1.7*    Recent Labs Lab 09/10/15 2005  LIPASE 25   No results for input(s): AMMONIA in the last 168 hours. CBC:  Recent Labs Lab 09/10/15 2005 09/11/15 0531  WBC 18.6* 13.5*  NEUTROABS 15.3*  --   HGB 11.3* 9.5*  HCT 32.4* 26.8*  MCV 109.5* 108.9*  PLT 165 131*    Cardiac Enzymes:   No results for input(s): CKTOTAL, CKMB, CKMBINDEX, TROPONINI in the last 168 hours. BNP (last 3 results) No results for input(s): BNP in the last 8760 hours.  ProBNP (last 3 results) No results for input(s): PROBNP in the last 8760 hours.  CBG: No results for input(s): GLUCAP in the last 168 hours.  No results found for this or any previous visit (from the past 240 hour(s)).   Studies: Dg Chest 2 View  09/10/2015  CLINICAL DATA:  49 year old female with jaundice and abdominal pain and swelling. EXAM: CHEST  2 VIEW COMPARISON:  None. FINDINGS: The cardiomediastinal silhouette is unremarkable. There is no evidence of focal airspace disease, pulmonary edema, suspicious pulmonary nodule/mass, pleural effusion, or pneumothorax. No acute bony abnormalities are identified. IMPRESSION: No active cardiopulmonary disease. Electronically Signed   By: Harmon Pier M.D.   On: 09/10/2015 18:46   [place] Art/ven Flow Abd Pelv Doppler Limited  09/11/2015  CLINICAL DATA:  Possible portal vein thrombus. EXAM: DUPLEX ULTRASOUND OF LIVER AND TIPS SHUNT TECHNIQUE: Color and duplex Doppler ultrasound was performed to evaluate the hepatic in-flow vessels. COMPARISON:  Ultrasound of same day. FINDINGS: Hepatic artery is patent with antegrade flow with peak systolic velocity of 168 centimeters/second. There appears to be occlusion of the proximal and middle portions of the portal vein consistent with thrombosis of indeterminate age. Retrograde flow is noted in the distal portion of the portal vein which flows into the proximal left portal vein. 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 These results will be called to the ordering clinician or representative by the Radiologist Assistant, and communication documented in the PACS or zVision Dashboard. Electronically Signed   By: Lupita Raider, M.D.   On: 09/11/2015 14:42   US Abdomen  Limited Ruq  09/11/2015  CLINICAL DATA:  Hepatic failure with jaundice. EXAM: US ABDOMEN LIMITED - RIGHT UPPER QUADRANT COMPARISON:  None. FINDINGS: Gallbladder: Sludge is seen in the gallbladder. No gallstones are appreciable. The gallbladder wall is thickened and edematous. There is no well-defined pericholecystic fluid. No sonographic Murphy sign noted. Common bile duct: Diameter: 3 mm. There is no intrahepatic or extrahepatic biliary duct dilatation. Liver: No focal lesion identified. Liver is diffusely echogenic. There is attenuated flow in the portal vein with subtle echogenic material in the main portal vein. A small amount of ascites is noted. IMPRESSION: The wall of the gallbladder is thickened and edematous. While ascites, which is present, can cause gallbladder wall thickening, the edematous appearance of the gallbladder raises concern for acalculus cholecystitis. No gallstones are seen; however, sludge is noted in the gallbladder. Liver shows diffuse increased echogenicity consistent with hepatic steatosis, likely with underlying parenchymal liver disease given the history. Question portal vein thrombosis. Doppler signal is attenuated in the main portal vein with subtle echogenic material concerning for thrombus in this vessel. Small amount of ascites. Electronically Signed  By: Bretta BangWilliam  Woodruff III M.D.   On: 09/11/2015 08:51    Scheduled Meds: . enoxaparin (LOVENOX) injection  40 mg Subcutaneous Q24H  . folic acid  1 mg Oral Daily  . multivitamin with minerals  1 tablet Oral Daily  . thiamine  100 mg Oral Daily   Or  . thiamine  100 mg Intravenous Daily    Continuous Infusions: . dextrose 5 % and 0.9% NaCl 100 mL/hr at 09/11/15 0040      Time spent:  25 minutes  KRISHNAN,SENDIL K  [facility] Pager [phone]. If 7PM-7AM, please contact night-coverage at [email or web address], password [email or web address] 09/11/2015, 4:26 PM  LOS: 1 day

## 2015-09-11 NOTE — Consult Note (Signed)
[facility] Consultation Oncology  Name: Kiara Warren      MRN: [id number]    Location: A306/A306-01  Date: 09/11/2015 Time:6:32 PM   REFERRING PHYSICIAN:  Gevena Barre, MD  REASON FOR CONSULT:  Role for anticoagulation?   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  HISTORY OF PRESENT ILLNESS:   49 yo white American female presenting to the ED with jaundice  I personally reviewed and went over laboratory results with the patient.  The results are noted within this dictation.  I personally reviewed and went over radiographic studies with the patient.  The results are noted within this dictation.    She notes a viral gastritis a few weeks ago with some resolution but then noted some bloating, pedal edema, and decreased appetite.  She then presented to the ED.  She notes that she has been "living on soup and V8" x months.  She denies any bleeding or bruising  Last EtOH few days ago.  PAST MEDICAL HISTORY:   Past Medical History  Diagnosis Date  . ETOH abuse   . Sciatica     ALLERGIES: No Known Allergies    MEDICATIONS: I have reviewed the patient's current medications.     PAST SURGICAL HISTORY Past Surgical History  Procedure Laterality Date  . None      FAMILY HISTORY: Family History  Problem Relation Age of Onset  . Cancer - Lung Father   . Cholecystitis Mother   . Diabetes Father   . Colon cancer Neg Hx    Her mother is alive and 31 years old.  She is healthy. Her father is deceased when the patient was 80 years old.   She has a 48 yo son who is healthy. She is an only child.  SOCIAL HISTORY:  reports that she has been smoking Cigarettes.  She has been smoking about 2.00 packs per day. She does not have any smokeless tobacco history on file. She reports that she drinks alcohol. She reports that she uses illicit drugs (Marijuana). She reports that she used to drink a few beers per night until  about 4-5 years ago when she started to date a man who made her nervous.  To help with her anxiety, she would have 4-5 shots of vodka prior to interacting with him.  She noted that as time went by, she started to shake and started to need more EtOH to help with her shakes which lead to tolerance to EtOH resulting in more EtOH.  She smokes 2 ppd x 30 years= 60 pack years.  She is separated/divorced.  PERFORMANCE STATUS: The patient's performance status is 0 - Asymptomatic  PHYSICAL EXAM: Most Recent Vital Signs: Blood pressure 106/69, pulse 89, temperature 98.6 F (37 C), temperature source Oral, resp. rate 20, height '5\' 7"'  (1.702 m), weight 184 lb (83.462 kg), SpO2 95 %. General appearance: alert, cooperative, appears stated age, no distress and mildly obese Head: Normocephalic, without obvious abnormality, atraumatic Neck: supple, symmetrical, trachea midline Lungs: clear to auscultation bilaterally Heart: regular rate and rhythm Abdomen: soft, non-tender; bowel sounds normal; no masses,  no organomegaly Extremities: extremities normal, atraumatic, no cyanosis or edema Skin: jaundice noted Neurologic: Alert and oriented X 3, normal strength and tone. Normal symmetric reflexes. Normal coordination and gait  LABORATORY DATA:  Results for orders placed or performed during the hospital encounter of 09/10/15 (from the past 48 hour(s))  CBC WITH DIFFERENTIAL  Status: Abnormal   Collection Time: 09/10/15  8:05 PM  Result Value Ref Range   WBC 18.6 (H) 4.0 - 10.5 K/uL   RBC 2.96 (L) 3.87 - 5.11 MIL/uL   Hemoglobin 11.3 (L) 12.0 - 15.0 g/dL   HCT 32.4 (L) 36.0 - 46.0 %   MCV 109.5 (H) 78.0 - 100.0 fL   MCH 38.2 (H) 26.0 - 34.0 pg   MCHC 34.9 30.0 - 36.0 g/dL   RDW 16.1 (H) 11.5 - 15.5 %   Platelets 165 150 - 400 K/uL   Neutrophils Relative % 82 %   Neutro Abs 15.3 (H) 1.7 - 7.7 K/uL   Lymphocytes Relative 9 %   Lymphs Abs 1.7 0.7 - 4.0 K/uL   Monocytes Relative 9 %   Monocytes  Absolute 1.6 (H) 0.1 - 1.0 K/uL   Eosinophils Relative 0 %   Eosinophils Absolute 0.1 0.0 - 0.7 K/uL   Basophils Relative 0 %   Basophils Absolute 0.0 0.0 - 0.1 K/uL  Lipase, blood     Status: None   Collection Time: 09/10/15  8:05 PM  Result Value Ref Range   Lipase 25 11 - 51 U/L  Hepatic function panel     Status: Abnormal   Collection Time: 09/10/15  8:05 PM  Result Value Ref Range   Total Protein 6.9 6.5 - 8.1 g/dL   Albumin 2.1 (L) 3.5 - 5.0 g/dL   AST 178 (H) 15 - 41 U/L   ALT 75 (H) 14 - 54 U/L   Alkaline Phosphatase 254 (H) 38 - 126 U/L   Total Bilirubin 18.0 (H) 0.3 - 1.2 mg/dL   Bilirubin, Direct 10.1 (H) 0.1 - 0.5 mg/dL   Indirect Bilirubin 7.9 (H) 0.3 - 0.9 mg/dL  Basic metabolic panel     Status: Abnormal   Collection Time: 09/10/15  8:05 PM  Result Value Ref Range   Sodium 131 (L) 135 - 145 mmol/L   Potassium 3.5 3.5 - 5.1 mmol/L   Chloride 93 (L) 101 - 111 mmol/L   CO2 26 22 - 32 mmol/L   Glucose, Bld 105 (H) 65 - 99 mg/dL   BUN <5 (L) 6 - 20 mg/dL   Creatinine, Ser 0.39 (L) 0.44 - 1.00 mg/dL   Calcium 8.2 (L) 8.9 - 10.3 mg/dL   GFR calc non Af Amer >60 >60 mL/min   GFR calc Af Amer >60 >60 mL/min    Comment: (NOTE) The eGFR has been calculated using the CKD EPI equation. This calculation has not been validated in all clinical situations. eGFR's persistently <60 mL/min signify possible Chronic Kidney Disease.    Anion gap 12 5 - 15  Magnesium     Status: Abnormal   Collection Time: 09/10/15  8:05 PM  Result Value Ref Range   Magnesium 1.2 (L) 1.7 - 2.4 mg/dL  Phosphorus     Status: Abnormal   Collection Time: 09/10/15  8:05 PM  Result Value Ref Range   Phosphorus 1.7 (L) 2.5 - 4.6 mg/dL  Protime-INR     Status: Abnormal   Collection Time: 09/10/15  8:05 PM  Result Value Ref Range   Prothrombin Time 23.0 (H) 11.6 - 15.2 seconds   INR 2.05 (H) 0.00 - 1.49  APTT     Status: Abnormal   Collection Time: 09/10/15  8:05 PM  Result Value Ref Range    aPTT 42 (H) 24 - 37 seconds    Comment:  IF BASELINE aPTT IS ELEVATED, SUGGEST PATIENT RISK ASSESSMENT BE USED TO DETERMINE APPROPRIATE ANTICOAGULANT THERAPY.   Urinalysis, Routine w reflex microscopic (not at [facility])     Status: Abnormal   Collection Time: 09/10/15 11:00 PM  Result Value Ref Range   Color, Urine BROWN (A) YELLOW    Comment: BIOCHEMICALS MAY BE AFFECTED BY COLOR   APPearance CLEAR CLEAR   Specific Gravity, Urine 1.025 1.005 - 1.030   pH 6.5 5.0 - 8.0   Glucose, UA 100 (A) NEGATIVE mg/dL   Hgb urine dipstick MODERATE (A) NEGATIVE   Bilirubin Urine LARGE (A) NEGATIVE   Ketones, ur 15 (A) NEGATIVE mg/dL   Protein, ur 100 (A) NEGATIVE mg/dL   Nitrite POSITIVE (A) NEGATIVE   Leukocytes, UA SMALL (A) NEGATIVE  Urine rapid drug screen (hosp performed)     Status: Abnormal   Collection Time: 09/10/15 11:00 PM  Result Value Ref Range   Opiates NONE DETECTED NONE DETECTED   Cocaine NONE DETECTED NONE DETECTED   Benzodiazepines NONE DETECTED NONE DETECTED   Amphetamines NONE DETECTED NONE DETECTED   Tetrahydrocannabinol POSITIVE (A) NONE DETECTED   Barbiturates NONE DETECTED NONE DETECTED    Comment:        DRUG SCREEN FOR MEDICAL PURPOSES ONLY.  IF CONFIRMATION IS NEEDED FOR ANY PURPOSE, NOTIFY LAB WITHIN 5 DAYS.        LOWEST DETECTABLE LIMITS FOR URINE DRUG SCREEN Drug Class       Cutoff (ng/mL) Amphetamine      1000 Barbiturate      200 Benzodiazepine   010 Tricyclics       272 Opiates          300 Cocaine          300 THC              50   Urine microscopic-add on     Status: Abnormal   Collection Time: 09/10/15 11:00 PM  Result Value Ref Range   Squamous Epithelial / LPF 6-30 (A) NONE SEEN    Comment: Please note change in reference range.   WBC, UA 6-30 0 - 5 WBC/hpf    Comment: Please note change in reference range.   RBC / HPF 0-5 0 - 5 RBC/hpf    Comment: Please note change in reference range.   Bacteria, UA MANY (A) NONE SEEN    Comment:  Please note change in reference range.  TSH     Status: None   Collection Time: 09/11/15  5:31 AM  Result Value Ref Range   TSH 2.089 0.350 - 4.500 uIU/mL  CBC     Status: Abnormal   Collection Time: 09/11/15  5:31 AM  Result Value Ref Range   WBC 13.5 (H) 4.0 - 10.5 K/uL   RBC 2.46 (L) 3.87 - 5.11 MIL/uL   Hemoglobin 9.5 (L) 12.0 - 15.0 g/dL   HCT 26.8 (L) 36.0 - 46.0 %   MCV 108.9 (H) 78.0 - 100.0 fL   MCH 38.6 (H) 26.0 - 34.0 pg   MCHC 35.4 30.0 - 36.0 g/dL   RDW 16.3 (H) 11.5 - 15.5 %   Platelets 131 (L) 150 - 400 K/uL  Comprehensive metabolic panel     Status: Abnormal   Collection Time: 09/11/15  5:31 AM  Result Value Ref Range   Sodium 132 (L) 135 - 145 mmol/L   Potassium 3.4 (L) 3.5 - 5.1 mmol/L   Chloride 95 (L) 101 - 111 mmol/L  CO2 28 22 - 32 mmol/L   Glucose, Bld 118 (H) 65 - 99 mg/dL   BUN 5 (L) 6 - 20 mg/dL   Creatinine, Ser 0.47 0.44 - 1.00 mg/dL   Calcium 7.9 (L) 8.9 - 10.3 mg/dL   Total Protein 5.7 (L) 6.5 - 8.1 g/dL   Albumin 1.7 (L) 3.5 - 5.0 g/dL   AST 137 (H) 15 - 41 U/L   ALT 59 (H) 14 - 54 U/L   Alkaline Phosphatase 203 (H) 38 - 126 U/L   Total Bilirubin 15.8 (H) 0.3 - 1.2 mg/dL   GFR calc non Af Amer >60 >60 mL/min   GFR calc Af Amer >60 >60 mL/min    Comment: (NOTE) The eGFR has been calculated using the CKD EPI equation. This calculation has not been validated in all clinical situations. eGFR's persistently <60 mL/min signify possible Chronic Kidney Disease.    Anion gap 9 5 - 15  Acetaminophen level     Status: Abnormal   Collection Time: 09/11/15  9:31 AM  Result Value Ref Range   Acetaminophen (Tylenol), Serum <10 (L) 10 - 30 ug/mL    Comment:        THERAPEUTIC CONCENTRATIONS VARY SIGNIFICANTLY. A RANGE OF 10-30 ug/mL MAY BE AN EFFECTIVE CONCENTRATION FOR MANY PATIENTS. HOWEVER, SOME ARE BEST TREATED AT CONCENTRATIONS OUTSIDE THIS RANGE. ACETAMINOPHEN CONCENTRATIONS >150 ug/mL AT 4 HOURS AFTER INGESTION AND >50 ug/mL AT 12 HOURS  AFTER INGESTION ARE OFTEN ASSOCIATED WITH TOXIC REACTIONS.   Protime-INR     Status: Abnormal   Collection Time: 09/11/15 12:57 PM  Result Value Ref Range   Prothrombin Time 22.2 (H) 11.6 - 15.2 seconds   INR 1.96 (H) 0.00 - 1.49      RADIOGRAPHY: Dg Chest 2 View  09/10/2015  CLINICAL DATA:  49 year old female with jaundice and abdominal pain and swelling. EXAM: CHEST  2 VIEW COMPARISON:  None. FINDINGS: The cardiomediastinal silhouette is unremarkable. There is no evidence of focal airspace disease, pulmonary edema, suspicious pulmonary nodule/mass, pleural effusion, or pneumothorax. No acute bony abnormalities are identified. IMPRESSION: No active cardiopulmonary disease. Electronically Signed   By: Margarette Canada M.D.   On: 09/10/2015 18:46   [place] Art/ven Flow Abd Pelv Doppler Limited  09/11/2015  CLINICAL DATA:  Possible portal vein thrombus. EXAM: DUPLEX ULTRASOUND OF LIVER AND TIPS SHUNT TECHNIQUE: Color and duplex Doppler ultrasound was performed to evaluate the hepatic in-flow vessels. COMPARISON:  Ultrasound of same day. FINDINGS: Hepatic artery is patent with antegrade flow with peak systolic velocity of 161 centimeters/second. There appears to be occlusion of the proximal and middle portions of the portal vein consistent with thrombosis of indeterminate age. Retrograde flow is noted in the distal portion of the portal vein which flows into the proximal left portal vein. 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 These results will be called to the ordering clinician or representative by the Radiologist Assistant, and communication documented in the PACS or zVision Dashboard. Electronically Signed   By: Marijo Conception, M.D.   On: 09/11/2015 14:42   US Abdomen Limited Ruq  09/11/2015  CLINICAL DATA:  Hepatic failure with jaundice. EXAM: US ABDOMEN LIMITED - RIGHT UPPER QUADRANT COMPARISON:  None. FINDINGS: Gallbladder: Sludge  is seen in the gallbladder. No gallstones are appreciable. The gallbladder wall is thickened and edematous. There is no well-defined pericholecystic fluid. No sonographic Murphy sign noted. Common bile duct: Diameter: 3 mm.  There is no intrahepatic or extrahepatic biliary duct dilatation. Liver: No focal lesion identified. Liver is diffusely echogenic. There is attenuated flow in the portal vein with subtle echogenic material in the main portal vein. A small amount of ascites is noted. IMPRESSION: The wall of the gallbladder is thickened and edematous. While ascites, which is present, can cause gallbladder wall thickening, the edematous appearance of the gallbladder raises concern for acalculus cholecystitis. No gallstones are seen; however, sludge is noted in the gallbladder. Liver shows diffuse increased echogenicity consistent with hepatic steatosis, likely with underlying parenchymal liver disease given the history. Question portal vein thrombosis. Doppler signal is attenuated in the main portal vein with subtle echogenic material concerning for thrombus in this vessel. Small amount of ascites. Electronically Signed   By: Lowella Grip III M.D.   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  She has abnormal coagulation studies.  Unsure if thrombosis is acute or chronic based upon imaging studies; particularly given coag studies.  Based upon the patient's history and symptoms, would favor chronic clot.  Would not anticoagulate currently.  Recommend recheck PT/INR in AM.  If markedly improved, would consider anticoagulation.  Recent INR is therapeutic and therefore additional anticoagulation at this time is useless.  However, utility in chronic clot is uncertain and controversial.  She should have evaluation for varices if anticoagulation is considered, prior to starting anticoagulation.  She will need 6 months  worth of anticoagulation if this is the treatment of choice.  Anemia is noted.  Recommend work-up for this prior to initiation of anticoagulation with stool cards.  Will perform peripheral anemia evaluation with anemia panel.  I have added Vit B12 and folate.  Ferritin and iron/TIBC is pending.  EtOH cessation education is provided.  Recommend AA.  She has never had a mammogram and therefore would recommend one as an inpatient.  Please coordinate follow-up with [facility] to discharge from the hospital.  All questions were answered. The patient knows to call the clinic with any problems, questions or concerns. We can certainly see the patient much sooner if necessary.  Patient and plan discussed with Dr. Ancil Linsey and she is in agreement with the aforementioned.   KEFALAS,THOMAS  09/11/2015 6:32 PM  AS above. Patient seen and examined. Had frank discussion about ongoing ETOH use. Would repeat INR in am, if continues to be elevated in spite of Vitamin K supplementation, would hold off on anticoagulation.  She needs additional eval of her anemia. Will f/u in am. Donald Pore MD

## 2015-09-12 ENCOUNTER — Inpatient Hospital Stay (HOSPITAL_COMMUNITY): Payer: Self-pay

## 2015-09-12 DIAGNOSIS — D649 Anemia, unspecified: Secondary | ICD-10-CM

## 2015-09-12 DIAGNOSIS — Z720 Tobacco use: Secondary | ICD-10-CM

## 2015-09-12 DIAGNOSIS — K7011 Alcoholic hepatitis with ascites: Secondary | ICD-10-CM

## 2015-09-12 DIAGNOSIS — K7200 Acute and subacute hepatic failure without coma: Secondary | ICD-10-CM | POA: Insufficient documentation

## 2015-09-12 DIAGNOSIS — K7030 Alcoholic cirrhosis of liver without ascites: Secondary | ICD-10-CM

## 2015-09-12 DIAGNOSIS — Z7289 Other problems related to lifestyle: Secondary | ICD-10-CM

## 2015-09-12 DIAGNOSIS — I81 Portal vein thrombosis: Secondary | ICD-10-CM

## 2015-09-12 LAB — CBC
HEMATOCRIT: 27.9 % — AB (ref 36.0–46.0)
HEMOGLOBIN: 9.6 g/dL — AB (ref 12.0–15.0)
MCH: 37.6 pg — ABNORMAL HIGH (ref 26.0–34.0)
MCHC: 34.4 g/dL (ref 30.0–36.0)
MCV: 109.4 fL — ABNORMAL HIGH (ref 78.0–100.0)
Platelets: 139 10*3/uL — ABNORMAL LOW (ref 150–400)
RBC: 2.55 MIL/uL — ABNORMAL LOW (ref 3.87–5.11)
RDW: 16.4 % — ABNORMAL HIGH (ref 11.5–15.5)
WBC: 13.8 10*3/uL — AB (ref 4.0–10.5)

## 2015-09-12 LAB — COMPREHENSIVE METABOLIC PANEL
ALBUMIN: 1.6 g/dL — AB (ref 3.5–5.0)
ALT: 52 U/L (ref 14–54)
ANION GAP: 7 (ref 5–15)
AST: 126 U/L — ABNORMAL HIGH (ref 15–41)
Alkaline Phosphatase: 214 U/L — ABNORMAL HIGH (ref 38–126)
BILIRUBIN TOTAL: 16.4 mg/dL — AB (ref 0.3–1.2)
BUN: <5 mg/dL — ABNORMAL LOW (ref 6–20)
CHLORIDE: 99 mmol/L — AB (ref 101–111)
CO2: 26 mmol/L (ref 22–32)
Calcium: 7.6 mg/dL — ABNORMAL LOW (ref 8.9–10.3)
Creatinine, Ser: <0.3 mg/dL — ABNORMAL LOW (ref 0.44–1.00)
Glucose, Bld: 114 mg/dL — ABNORMAL HIGH (ref 65–99)
POTASSIUM: 3.1 mmol/L — AB (ref 3.5–5.1)
Sodium: 132 mmol/L — ABNORMAL LOW (ref 135–145)
TOTAL PROTEIN: 5.6 g/dL — AB (ref 6.5–8.1)

## 2015-09-12 LAB — PROTIME-INR
INR: 2.11 — AB (ref 0.00–1.49)
Prothrombin Time: 23.5 seconds — ABNORMAL HIGH (ref 11.6–15.2)

## 2015-09-12 LAB — CERULOPLASMIN: CERULOPLASMIN: 34.1 mg/dL (ref 19.0–39.0)

## 2015-09-12 LAB — HEMOGLOBIN A1C
Hgb A1c MFr Bld: 4.8 % (ref 4.8–5.6)
Mean Plasma Glucose: 91 mg/dL

## 2015-09-12 LAB — ANTI-SMOOTH MUSCLE ANTIBODY, IGG: F-Actin IgG: 14 Units (ref 0–19)

## 2015-09-12 LAB — HEPATITIS PANEL, ACUTE
HCV Ab: <0.1 s/co ratio (ref 0.0–0.9)
HEP A IGM: NEGATIVE
Hep B C IgM: NEGATIVE
Hepatitis B Surface Ag: NEGATIVE

## 2015-09-12 LAB — MAGNESIUM: MAGNESIUM: 1.3 mg/dL — AB (ref 1.7–2.4)

## 2015-09-12 LAB — FOLATE: FOLATE: 7.5 ng/mL (ref >5.9)

## 2015-09-12 LAB — MITOCHONDRIAL ANTIBODIES: MITOCHONDRIAL M2 AB, IGG: 17.1 U (ref 0.0–20.0)

## 2015-09-12 LAB — ANTINUCLEAR ANTIBODIES, IFA: ANTINUCLEAR ANTIBODIES, IFA: NEGATIVE

## 2015-09-12 MED ORDER — IOHEXOL 350 MG/ML SOLN
85.0000 mL | Freq: Once | INTRAVENOUS | Status: AC | PRN
  Administered 2015-09-12: 85 mL via INTRAVENOUS

## 2015-09-12 MED ORDER — PREDNISOLONE 15 MG/5ML PO SOLN
40.0000 mg | Freq: Every day | ORAL | Status: DC
  Administered 2015-09-12 – 2015-09-14 (×3): 40 mg via ORAL
  Filled 2015-09-12 (×6): qty 15

## 2015-09-12 MED ORDER — SODIUM CHLORIDE 0.9 % IJ SOLN
3.0000 mL | Freq: Two times a day (BID) | INTRAMUSCULAR | Status: DC
  Administered 2015-09-12 – 2015-09-13 (×4): 3 mL via INTRAVENOUS

## 2015-09-12 MED ORDER — DIATRIZOATE MEGLUMINE & SODIUM 66-10 % PO SOLN
ORAL | Status: AC
  Administered 2015-09-12: 09:00:00
  Filled 2015-09-12: qty 30

## 2015-09-12 MED ORDER — SODIUM CHLORIDE 0.9 % IV SOLN: 250.0000 mL | INTRAVENOUS | Status: DC | PRN

## 2015-09-12 MED ORDER — SODIUM CHLORIDE 0.9 % IJ SOLN: 3.0000 mL | INTRAMUSCULAR | Status: DC | PRN

## 2015-09-12 MED ORDER — HYDROCORTISONE ACETATE 25 MG RE SUPP
25.0000 mg | Freq: Once | RECTAL | Status: AC
  Administered 2015-09-12: 25 mg via RECTAL
  Filled 2015-09-12: qty 1

## 2015-09-12 MED ORDER — ENSURE ENLIVE PO LIQD
237.0000 mL | Freq: Three times a day (TID) | ORAL | Status: DC
  Administered 2015-09-12 – 2015-09-14 (×5): 237 mL via ORAL

## 2015-09-12 MED ORDER — DEXTROSE 5 % IV SOLN
1.0000 g | INTRAVENOUS | Status: DC
  Administered 2015-09-12 – 2015-09-13 (×2): 1 g via INTRAVENOUS
  Filled 2015-09-12 (×3): qty 10

## 2015-09-12 NOTE — Progress Notes (Addendum)
[facility] PROGRESS NOTE  Kiara Warren ZOX:[id number] DOB: Apr 14, 1966 DOA: 09/10/2015  PCP: No primary care provider on file.  Brief HPI: 49 year old Caucasian female with a past medical history of cholelithiasis. Heavy alcohol abuse presented with complaints of abdominal distention and jaundice. She was found to have a significantly elevated bilirubin in the emergency department. She was hospitalized for further management. She was seen by gastroenterology. Ultrasound also showed portal vein thrombosis. Hematology and oncology was also consulted.  Past medical history:  Past Medical History  Diagnosis Date  . ETOH abuse   . Sciatica     Consultants: Gastroenterology, hematology and oncology  Procedures: None  Antibiotics: None  Subjective: Patient continues to have some distention in her abdomen. She continues to have discomfort in her upper abdomen. Some nausea. No vomiting. No diarrhea.  Objective: Vital Signs  Filed Vitals:   09/11/15 0611 09/11/15 1414 09/11/15 2253 09/12/15 0549  BP: 101/69 106/69 112/80 114/72  Pulse: 93 89 111 85  Temp: 98.9 F (37.2 C) 98.6 F (37 C) 98.5 F (36.9 C) 98.1 F (36.7 C)  TempSrc: Oral Oral Oral Oral  Resp: Height:      Weight:      SpO2: 96% 95% 93% 97%    Intake/Output Summary (Last 24 hours) at 09/12/15 0758 Last data filed at 09/11/15 1400  Gross per 24 hour  Intake    480 ml  Output      0 ml  Net    480 ml   Filed Weights   09/10/15 1812 09/11/15 0010  Weight: 82.963 kg (182 lb 14.4 oz) 83.462 kg (184 lb)    General appearance: alert, cooperative, appears stated age and no distress Resp: clear to auscultation bilaterally Cardio: regular rate and rhythm, S1, S2 normal, no murmur, click, rub or gallop GI: Distended abdomen soft. Fullness in the upper abdomen with possibly a mass, although it is difficult to appreciate. No rebound, rigidity or guarding. No obvious organomegaly on  examination. Extremities: extremities normal, atraumatic, no cyanosis or edema Neurologic: Alert and oriented 3. No focal neurological deficits noted.  Lab Results:  Basic Metabolic Panel:  Recent Labs Lab 09/10/15 2005 09/11/15 0531 09/12/15 0539  NA 131* 132* 132*  K 3.5 3.4* 3.1*  CL 93* 95* 99*  CO2 GLUCOSE 105* 118* 114*  BUN <5* 5* <5*  CREATININE 0.39* 0.47 <0.30*  CALCIUM 8.2* 7.9* 7.6*  MG 1.2*  --   --   PHOS 1.7*  --   --    Liver Function Tests:  Recent Labs Lab 09/10/15 2005 09/11/15 0531 09/12/15 0539  AST 178* 137* 126*  ALT 75* 59* 52  ALKPHOS 254* 203* 214*  BILITOT 18.0* 15.8* 16.4*  PROT 6.9 5.7* 5.6*  ALBUMIN 2.1* 1.7* 1.6*    Recent Labs Lab 09/10/15 2005  LIPASE 25   CBC:  Recent Labs Lab 09/10/15 2005 09/11/15 0531 09/12/15 0539  WBC 18.6* 13.5* 13.8*  NEUTROABS 15.3*  --   --   HGB 11.3* 9.5* 9.6*  HCT 32.4* 26.8* 27.9*  MCV 109.5* 108.9* 109.4*  PLT 165 131* 139*     Studies/Results: Dg Chest 2 View  09/10/2015  CLINICAL DATA:  49 year old female with jaundice and abdominal pain and swelling. EXAM: CHEST  2 VIEW COMPARISON:  None. FINDINGS: The cardiomediastinal silhouette is unremarkable. There is no evidence of focal airspace disease, pulmonary edema, suspicious pulmonary nodule/mass, pleural effusion, or pneumothorax. No  acute bony abnormalities are identified. IMPRESSION: No active cardiopulmonary disease. Electronically Signed   By: Harmon Pier M.D.   On: 09/10/2015 18:46   [place] Art/ven Flow Abd Pelv Doppler Limited  09/11/2015  CLINICAL DATA:  Possible portal vein thrombus. EXAM: DUPLEX ULTRASOUND OF LIVER AND TIPS SHUNT TECHNIQUE: Color and duplex Doppler ultrasound was performed to evaluate the hepatic in-flow vessels. COMPARISON:  Ultrasound of same day. FINDINGS: Hepatic artery is patent with antegrade flow with peak systolic velocity of 168 centimeters/second. There appears to be occlusion of the proximal  and middle portions of the portal vein consistent with thrombosis of indeterminate age. Retrograde flow is noted in the distal portion of the portal vein which flows into the proximal left portal vein. 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 These results will be called to the ordering clinician or representative by the Radiologist Assistant, and communication documented in the PACS or zVision Dashboard. Electronically Signed   By: Lupita Raider, M.D.   On: 09/11/2015 14:42   US Abdomen Limited Ruq  09/11/2015  CLINICAL DATA:  Hepatic failure with jaundice. EXAM: US ABDOMEN LIMITED - RIGHT UPPER QUADRANT COMPARISON:  None. FINDINGS: Gallbladder: Sludge is seen in the gallbladder. No gallstones are appreciable. The gallbladder wall is thickened and edematous. There is no well-defined pericholecystic fluid. No sonographic Murphy sign noted. Common bile duct: Diameter: 3 mm. There is no intrahepatic or extrahepatic biliary duct dilatation. Liver: No focal lesion identified. Liver is diffusely echogenic. There is attenuated flow in the portal vein with subtle echogenic material in the main portal vein. A small amount of ascites is noted. IMPRESSION: The wall of the gallbladder is thickened and edematous. While ascites, which is present, can cause gallbladder wall thickening, the edematous appearance of the gallbladder raises concern for acalculus cholecystitis. No gallstones are seen; however, sludge is noted in the gallbladder. Liver shows diffuse increased echogenicity consistent with hepatic steatosis, likely with underlying parenchymal liver disease given the history. Question portal vein thrombosis. Doppler signal is attenuated in the main portal vein with subtle echogenic material concerning for thrombus in this vessel. Small amount of ascites. Electronically Signed   By: Bretta Bang III M.D.   On: 09/11/2015 08:51    Medications:    Scheduled: . enoxaparin (LOVENOX) injection  40 mg Subcutaneous Q24H  . feeding supplement (ENSURE ENLIVE)  237 mL Oral TID BM  . folic acid  1 mg Oral Daily  . multivitamin with minerals  1 tablet Oral Daily  . sodium chloride  3 mL Intravenous Q12H  . thiamine  100 mg Oral Daily   Or  . thiamine  100 mg Intravenous Daily   Continuous:  ZOX:WRUEAV chloride, LORazepam **OR** LORazepam, ondansetron **OR** ondansetron (ZOFRAN) IV, sodium chloride  Assessment/Plan:  Principal Problem:   Acute liver failure Active Problems:   Jaundice   Hyperbilirubinemia   Abdominal pain   ETOH abuse   Leukocytosis    Acute liver failure with secondary hyperbilirubinemia, coagulopathy and abdominal pain Pain likely secondary to abdominal distention from mild ascites. GI is following. Fullness appreciated in the epigastric area on examination. Ultrasound report reviewed. Unclear if this is hepatomegaly. No other imaging studies noted in the records. We will proceed with CT scan of the abdomen and pelvis.   Portal vein thrombosis Thought to be of indeterminate age, based on ultrasound report. Hematology and oncology was consulted to weigh in. Patient is already auto anticoagulation  due to her liver disease. Defer to oncology regarding further recommendations.  History of alcohol abuse This has been extensively discussed with the patient. Her last alcohol consumption was about 2-3 days prior to admission. No evidence for withdrawal symptoms currently. Continue CIWA protocol. Continue thiamine and multivitamins.  Leukocytosis/Questionable UTI Possibly secondary to stress margination. But UA noted to be abnormal. Start Ceftriaxone. Send urine for culture.  Hyponatremia and hyperkalemia Replace potassium. Low sodium level, likely due to her liver disease.  Macrocytic anemia Hemoglobin is stable. TSH is normal. B-12 levels normal. Folate level is pending. Likely due to alcoholism.   DVT  Prophylaxis: Lovenox     Code Status: Full code  Family Communication: Discussed with the patient  Disposition Plan: Await CT scan. Await specialty input.    LOS: 2 days   [facility] HospitalKRISHNAN,Lakiyah Arntson  [facility] Pager [phone] 09/12/2015, 7:58 AM  If 7PM-7AM, please contact night-coverage at [email or web address], password [email or web address]

## 2015-09-12 NOTE — Progress Notes (Signed)
Initial Nutrition Assessment  DOCUMENTATION CODES:  Not applicable  INTERVENTION:  Ensure Enlive po BID, each supplement provides 350 kcal and 20 grams of protein  Recommend MVI, Folate, Thiamin   NUTRITION DIAGNOSIS:  Inadequate oral intake related to ETOH abuse as evidenced by reportedly almost exclusively having 3/4 bottle V8 juice/day for 2 weeks  GOAL:  Patient will meet greater than or equal to 90% of their needs  MONITOR:  PO intake, Supplement acceptance, Weight trends, Labs, Skin  REASON FOR ASSESSMENT:  Consult Assessment of nutrition requirement/status  ASSESSMENT:  49 y/o female PMHx Tobacco/ETOH abuse who presents with pedal edema, abdominal pain, jaundice and acute liver failure, likely secondary to ETOH abuse. Meld Scote ~33.   Per MD notes, Pt has been drinking a fifth of vodka x 4-5 years. Past 2 weeks has had poor appetite and cant eat anything heavy.   Pt Has been primarily been drinking V8 recently. Large bottle noted at side of bed. She states she drank 3/4s of one of those daily. She has followed a liquid diet these past 2 weeks. Besides the v8, she would have soup. She denied taking any vit/mineral supplements recently. During this time she notes a strong sense of fullness which caused her to turn to liquids. She also reports diarrhea and frequent/sudden urges to have a BM or urinate "Some times very little comes out and other times a lot does".   She has not weighed herself recently and does not know what her normal weight is. Her best guess at UBW is 165 lbs.   She says that she really wants to eat the meals that are being brought to her, but she still has a very strong sense of fullness. The CT prep that she was given has also upset her stomach. She also is very weak and has general malaise.   Explained to patient the importance of protein. She was agreeable to Ensure BID Will also require thiamin/folate supplement/   NFPE: Not perform as I interrupted  her sleeping and was tightly wrapped in covers.  Unable to dx with malnutrition at this time.   Labs reviewed: Very numerous abnormalities present.   Diet Order:  Diet 2 gram sodium Room service appropriate?: Yes; Fluid consistency:: Thin  Skin:  Jaundiced  Last BM:  11/29  Height:  Ht Readings from Last 1 Encounters:  09/11/15 5\' 7"  (1.702 m)   Weight:  Wt Readings from Last 1 Encounters:  09/11/15 184 lb (83.462 kg)   Ideal Body Weight:  61.36 kg  BMI:  Body mass index is 28.81 kg/(m^2).  Estimated Nutritional Needs:  Kcal:  1850-2000 kcals (22-24 kcal/kg bw) Protein:  74-86 g Pro (1.2-1.4 g/kg IBW) Fluid:  1.8-2 liters  EDUCATION NEEDS:  No education needs identified at this time  Christophe Louisathan Kyndahl Jablon RD, LDN Nutrition Pager: [phone] 09/12/2015 1:42 PM

## 2015-09-12 NOTE — Progress Notes (Signed)
Subjective: I personally reviewed and went over laboratory results with the patient.  The results are noted within this dictation.  INR is noted to be >2 today.  She denies any complaints at this time.  Objective: Vital signs in last 24 hours: Temp:  [98.1 F (36.7 C)-98.5 F (36.9 C)] 98.4 F (36.9 C) (11/30 1437) Pulse Rate:  [85-111] 91 (11/30 1437) Resp:  [18-20] 18 (11/30 1437) BP: (111-114)/(72-80) 111/76 mmHg (11/30 1437) SpO2:  [93 %-98 %] 98 % (11/30 1437)  Intake/Output from previous day: 11/29 0800 - 11/30 0759 In: 2843.3 [P.O.:480; I.V.:2363.3] Out: -  Intake/Output this shift: Total I/O In: 641.3 [P.O.:120; I.V.:521.3] Out: -   General appearance: alert, cooperative and in bed, unaccompanied  Lab Results:   Recent Labs  09/11/15 0531 09/12/15 0539  WBC 13.5* 13.8*  HGB 9.5* 9.6*  HCT 26.8* 27.9*  PLT 131* 139*   BMET  Recent Labs  09/11/15 0531 09/12/15 0539  NA 132* 132*  K 3.4* 3.1*  CL 95* 99*  CO2 28 26  GLUCOSE 118* 114*  BUN 5* <5*  CREATININE 0.47 <0.30*  CALCIUM 7.9* 7.6*    Studies/Results: Dg Chest 2 View  09/10/2015  CLINICAL DATA:  49 year old female with jaundice and abdominal pain and swelling. EXAM: CHEST  2 VIEW COMPARISON:  None. FINDINGS: The cardiomediastinal silhouette is unremarkable. There is no evidence of focal airspace disease, pulmonary edema, suspicious pulmonary nodule/mass, pleural effusion, or pneumothorax. No acute bony abnormalities are identified. IMPRESSION: No active cardiopulmonary disease. Electronically Signed   By: Harmon Pier M.D.   On: 09/10/2015 18:46   Ct Abdomen Pelvis W Contrast  09/12/2015  CLINICAL DATA:  Jaundice starting 09/07/2015, upper abdominal pain and swelling EXAM: CT ABDOMEN AND PELVIS WITH CONTRAST TECHNIQUE: Multidetector CT imaging of the abdomen and pelvis was performed using the standard protocol following bolus administration of intravenous contrast. CONTRAST:  85mL OMNIPAQUE  IOHEXOL 350 MG/ML SOLN COMPARISON:  Abdominal ultrasound 09/11/2015 and CT scan 04/18/2008 FINDINGS: Sagittal images of the spine shows mild degenerative changes lower thoracic spine. Heart size within normal limits.  Small hiatal hernia is noted. Hepatomegaly is noted. The liver measures 28 cm in length. There is significant fatty infiltration of the liver. Perihepatic ascites is noted. Small perisplenic ascites. There is poorly visualized low-density lesion in right hepatic lobe just above the gallbladder measures 2.3 cm. This is indeterminate. Further correlation with enhanced MRI with delay images is recommended. The pancreas, spleen and adrenal glands are unremarkable. Kidneys are symmetrical in size and enhancement. No hydronephrosis or hydroureter. Portal vein measures 1.1 cm in diameter. No evidence of portal hypertension. There is small ascites bilateral paracolic gutter right greater than left. Moderate pelvic ascites is noted. No aortic aneurysm. Delayed renal images shows bilateral renal symmetrical excretion. Oral contrast material was given to the patient. No small bowel obstruction. No free abdominal air. There is no pericecal inflammation. The terminal ileum is unremarkable.  The appendix is not identified. The uterus and adnexa are unremarkable. No adnexal mass. Small amount of air is noted within anterior superior aspect of the urinary bladder probable post instrumentation. No destructive bony lesions are noted within pelvis. Mild pericholecystic fluid. Mild enhancement of gallbladder wall. Although this may be due to ascites cholecystitis cannot be excluded. Clinical correlation is necessary. No calcified gallstones are noted within gallbladder. No aortic aneurysm. IMPRESSION: 1. Small hiatal hernia is noted. 2. There is hepatomegaly. Significant fatty infiltration of the liver. Indeterminate low-density lesion in right  hepatic lobe just above the gallbladder measures 2.3 cm. Further correlation  with enhanced MRI is recommended. 3. There is perihepatic and perisplenic ascites. Ascites is noted bilateral paracolic gutters right greater than left. Moderate pelvic ascites. No free abdominal air. 4. No small bowel obstruction. 5. There is mild enhancement of the gallbladder wall. Mild pericholecystic fluid. Although this may be due to ascites cholecystitis cannot be excluded. Clinical correlation is necessary. 6. No colonic obstruction. Mild degenerative changes thoracolumbar spine. Electronically Signed   By: Natasha Mead M.D.   On: 09/12/2015 12:22   [place] Art/ven Flow Abd Pelv Doppler Limited  09/11/2015  CLINICAL DATA:  Possible portal vein thrombus. EXAM: DUPLEX ULTRASOUND OF LIVER AND TIPS SHUNT TECHNIQUE: Color and duplex Doppler ultrasound was performed to evaluate the hepatic in-flow vessels. COMPARISON:  Ultrasound of same day. FINDINGS: Hepatic artery is patent with antegrade flow with peak systolic velocity of 168 centimeters/second. There appears to be occlusion of the proximal and middle portions of the portal vein consistent with thrombosis of indeterminate age. Retrograde flow is noted in the distal portion of the portal vein which flows into the proximal left portal vein. 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 These results will be called to the ordering clinician or representative by the Radiologist Assistant, and communication documented in the PACS or zVision Dashboard. Electronically Signed   By: Lupita Raider, M.D.   On: 09/11/2015 14:42   US Abdomen Limited Ruq  09/11/2015  CLINICAL DATA:  Hepatic failure with jaundice. EXAM: US ABDOMEN LIMITED - RIGHT UPPER QUADRANT COMPARISON:  None. FINDINGS: Gallbladder: Sludge is seen in the gallbladder. No gallstones are appreciable. The gallbladder wall is thickened and edematous. There is no well-defined pericholecystic fluid. No sonographic Murphy sign noted. Common bile  duct: Diameter: 3 mm. There is no intrahepatic or extrahepatic biliary duct dilatation. Liver: No focal lesion identified. Liver is diffusely echogenic. There is attenuated flow in the portal vein with subtle echogenic material in the main portal vein. A small amount of ascites is noted. IMPRESSION: The wall of the gallbladder is thickened and edematous. While ascites, which is present, can cause gallbladder wall thickening, the edematous appearance of the gallbladder raises concern for acalculus cholecystitis. No gallstones are seen; however, sludge is noted in the gallbladder. Liver shows diffuse increased echogenicity consistent with hepatic steatosis, likely with underlying parenchymal liver disease given the history. Question portal vein thrombosis. Doppler signal is attenuated in the main portal vein with subtle echogenic material concerning for thrombus in this vessel. Small amount of ascites. Electronically Signed   By: Bretta Bang III M.D.   On: 09/11/2015 08:51    Medications: I have reviewed the patient's current medications.  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 Acuity of thrombosis is unknown, making anticoagulation controversial.  INR is > 2, despite Vit K administration.  Patient is auto-anticoagulated and therefore, anticoagulation is not recommended.  Will set the patient up for an outpatient appointment at the [facility] in 3 weeks for follow-up.  Hematology will sign off at this time.  Please call with any questions.   Patient and plan discussed with Dr. Loma Messing and she is in agreement with the aforementioned.    LOS: 2 days    KEFALAS,THOMAS 09/12/2015 6:39 PM  As above. Would not currently anticoagulate given INR. In addition, anticoagulation in this setting is most beneficial  in acute clot, hard to determine for her. Will re-evaluate at outpatient follow-up. Chiquita LothS  Bettina Warn MD

## 2015-09-12 NOTE — Progress Notes (Signed)
Subjective:  Drinking oral contrast for CT. Complains it makes her nauseated. No specific complaints otherwose.   Objective: Vital signs in last 24 hours: Temp:  [98.1 F (36.7 C)-98.6 F (37 C)] 98.1 F (36.7 C) (11/30 0549) Pulse Rate:  [85-111] 85 (11/30 0549) Resp:  [20] 20 (11/30 0549) BP: (106-114)/(69-80) 114/72 mmHg (11/30 0549) SpO2:  [93 %-97 %] 97 % (11/30 0549) Last BM Date: 09/11/15 General:   Alert,  jaundiced, pleasant and cooperative in NAD Head:  Normocephalic and atraumatic. Eyes:  Sclera clear, + icterus.  Abdomen:  Soft, full in ruq, nontender. Slight distention. Normal bowel sounds, without guarding, and without rebound.   Extremities:  Without clubbing, deformity or edema. Neurologic:  Alert and  oriented x4;  grossly normal neurologically. Skin:  Intact without significant lesions or rashes. Psych:  Alert and cooperative. Normal mood and affect.  Intake/Output from previous day: 11/29 0701 - 11/30 0700 In: 480 [P.O.:480] Out: -  Intake/Output this shift:    Lab Results: CBC  Recent Labs  09/10/15 2005 09/11/15 0531 09/12/15 0539  WBC 18.6* 13.5* 13.8*  HGB 11.3* 9.5* 9.6*  HCT 32.4* 26.8* 27.9*  MCV 109.5* 108.9* 109.4*  PLT 165 131* 139*   BMET  Recent Labs  09/10/15 2005 09/11/15 0531 09/12/15 0539  NA 131* 132* 132*  K 3.5 3.4* 3.1*  CL 93* 95* 99*  CO2 GLUCOSE 105* 118* 114*  BUN <5* 5* <5*  CREATININE 0.39* 0.47 <0.30*  CALCIUM 8.2* 7.9* 7.6*   LFTs  Recent Labs  09/10/15 2005 09/11/15 0531 09/12/15 0539  BILITOT 18.0* 15.8* 16.4*  BILIDIR 10.1*  --   --   IBILI 7.9*  --   --   ALKPHOS 254* 203* 214*  AST 178* 137* 126*  ALT 75* 59* 52  PROT 6.9 5.7* 5.6*  ALBUMIN 2.1* 1.7* 1.6*    Recent Labs  09/10/15 2005  LIPASE 25   PT/INR  Recent Labs  09/10/15 2005 09/11/15 1257 09/12/15 0539  LABPROT 23.0* 22.2* 23.5*  INR 2.05* 1.96* 2.11*      Imaging Studies: Dg Chest 2 View  09/10/2015   CLINICAL DATA:  49 year old female with jaundice and abdominal pain and swelling. EXAM: CHEST  2 VIEW COMPARISON:  None. FINDINGS: The cardiomediastinal silhouette is unremarkable. There is no evidence of focal airspace disease, pulmonary edema, suspicious pulmonary nodule/mass, pleural effusion, or pneumothorax. No acute bony abnormalities are identified. IMPRESSION: No active cardiopulmonary disease. Electronically Signed   By: Harmon Pier M.D.   On: 09/10/2015 18:46   [place] Art/ven Flow Abd Pelv Doppler Limited  09/11/2015  CLINICAL DATA:  Possible portal vein thrombus. EXAM: DUPLEX ULTRASOUND OF LIVER AND TIPS SHUNT TECHNIQUE: Color and duplex Doppler ultrasound was performed to evaluate the hepatic in-flow vessels. COMPARISON:  Ultrasound of same day. FINDINGS: Hepatic artery is patent with antegrade flow with peak systolic velocity of 168 centimeters/second. There appears to be occlusion of the proximal and middle portions of the portal vein consistent with thrombosis of indeterminate age. Retrograde flow is noted in the distal portion of the portal vein which flows into the proximal left portal vein. 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 These results will be called to the ordering clinician or representative by the Radiologist Assistant, and communication documented in the PACS or zVision Dashboard. Electronically Signed   By: Lupita Raider, M.D.   On: 09/11/2015 14:42  [place] Abdomen Limited Ruq  09/11/2015  CLINICAL DATA:  Hepatic failure with jaundice. EXAM: US ABDOMEN LIMITED - RIGHT UPPER QUADRANT COMPARISON:  None. FINDINGS: Gallbladder: Sludge is seen in the gallbladder. No gallstones are appreciable. The gallbladder wall is thickened and edematous. There is no well-defined pericholecystic fluid. No sonographic Murphy sign noted. Common bile duct: Diameter: 3 mm. There is no intrahepatic or extrahepatic biliary duct dilatation.  Liver: No focal lesion identified. Liver is diffusely echogenic. There is attenuated flow in the portal vein with subtle echogenic material in the main portal vein. A small amount of ascites is noted. IMPRESSION: The wall of the gallbladder is thickened and edematous. While ascites, which is present, can cause gallbladder wall thickening, the edematous appearance of the gallbladder raises concern for acalculus cholecystitis. No gallstones are seen; however, sludge is noted in the gallbladder. Liver shows diffuse increased echogenicity consistent with hepatic steatosis, likely with underlying parenchymal liver disease given the history. Question portal vein thrombosis. Doppler signal is attenuated in the main portal vein with subtle echogenic material concerning for thrombus in this vessel. Small amount of ascites. Electronically Signed   By: Bretta BangWilliam  Woodruff III M.D.   On: 09/11/2015 08:51  [2 weeks]   Assessment: 49 year old female admitted with likely acute alcoholic hepatitis, cannot rule out chronic underlying liver disease in setting of acute liver failure. Mild improvement in LFTs from admission. Viral markers negative other serologies pending for autoimmune/PBC. No obvious signs/symptoms of infection and leukocytosis is improving. Question reactive. Small amount of ascites on US but doubt SBP. Portal Vein Thrombosis as noted in setting of INR >2. No anticoagulation recommended at this time per hematology. Discussed at length with patient the concern for mortality if persistent ETOH use.   DF 69. Hep C Ab negative. Start prednisolone for etoh hepatitis.  CT today showed hepatomegaly with ascites but no evidence of portal hypertension. Significant fatty liver. Indeterminate low-density lesion in right hepatic lobe just above gallbladder measures 2.3 cm.   Plan: 1. Start prednisolone 40mg  daily. 2. She will need MRI liver to further evaluate right hepatic lobe lesion, possibly as outpatient.   3. Follow up pending serologies.  4. Absolute ETOH cessation.   Leanna BattlesLeslie S. Dixon BoosLewis, PA-C [facility] [phone] 11/30/20161:32 PM  Patient seen and examined. Reviewed CT findings with patient this evening. Agree with need for follow-up MRI. Prednisolone therapy initiated.   LOS: 2 days

## 2015-09-13 ENCOUNTER — Inpatient Hospital Stay (HOSPITAL_COMMUNITY): Payer: Self-pay

## 2015-09-13 DIAGNOSIS — F1010 Alcohol abuse, uncomplicated: Secondary | ICD-10-CM

## 2015-09-13 DIAGNOSIS — K7689 Other specified diseases of liver: Secondary | ICD-10-CM

## 2015-09-13 DIAGNOSIS — K769 Liver disease, unspecified: Secondary | ICD-10-CM | POA: Insufficient documentation

## 2015-09-13 DIAGNOSIS — E876 Hypokalemia: Secondary | ICD-10-CM | POA: Insufficient documentation

## 2015-09-13 DIAGNOSIS — R17 Unspecified jaundice: Secondary | ICD-10-CM

## 2015-09-13 LAB — CBC
HCT: 28.6 % — ABNORMAL LOW (ref 36.0–46.0)
HEMOGLOBIN: 9.9 g/dL — AB (ref 12.0–15.0)
MCH: 37.8 pg — AB (ref 26.0–34.0)
MCHC: 34.6 g/dL (ref 30.0–36.0)
MCV: 109.2 fL — ABNORMAL HIGH (ref 78.0–100.0)
Platelets: 154 10*3/uL (ref 150–400)
RBC: 2.62 MIL/uL — ABNORMAL LOW (ref 3.87–5.11)
RDW: 16.7 % — AB (ref 11.5–15.5)
WBC: 13.7 10*3/uL — ABNORMAL HIGH (ref 4.0–10.5)

## 2015-09-13 LAB — URINE CULTURE: Culture: NO GROWTH

## 2015-09-13 LAB — COMPREHENSIVE METABOLIC PANEL
ALBUMIN: 1.6 g/dL — AB (ref 3.5–5.0)
ALT: 46 U/L (ref 14–54)
ANION GAP: 5 (ref 5–15)
AST: 101 U/L — ABNORMAL HIGH (ref 15–41)
Alkaline Phosphatase: 209 U/L — ABNORMAL HIGH (ref 38–126)
BILIRUBIN TOTAL: 17.3 mg/dL — AB (ref 0.3–1.2)
BUN: <5 mg/dL — ABNORMAL LOW (ref 6–20)
CHLORIDE: 101 mmol/L (ref 101–111)
CO2: 28 mmol/L (ref 22–32)
Calcium: 7.9 mg/dL — ABNORMAL LOW (ref 8.9–10.3)
Creatinine, Ser: 0.34 mg/dL — ABNORMAL LOW (ref 0.44–1.00)
GFR calc Af Amer: >60 mL/min (ref >60)
Glucose, Bld: 126 mg/dL — ABNORMAL HIGH (ref 65–99)
POTASSIUM: 3.3 mmol/L — AB (ref 3.5–5.1)
SODIUM: 134 mmol/L — AB (ref 135–145)
TOTAL PROTEIN: 5.5 g/dL — AB (ref 6.5–8.1)

## 2015-09-13 LAB — MAGNESIUM: MAGNESIUM: 1.5 mg/dL — AB (ref 1.7–2.4)

## 2015-09-13 LAB — PROTIME-INR
INR: 2.25 — AB (ref 0.00–1.49)
PROTHROMBIN TIME: 24.7 s — AB (ref 11.6–15.2)

## 2015-09-13 MED ORDER — GADOXETATE DISODIUM 0.25 MMOL/ML IV SOLN
8.0000 mL | Freq: Once | INTRAVENOUS | Status: AC | PRN
  Administered 2015-09-13: 8 mL via INTRAVENOUS

## 2015-09-13 MED ORDER — POTASSIUM CHLORIDE CRYS ER 20 MEQ PO TBCR
40.0000 meq | EXTENDED_RELEASE_TABLET | ORAL | Status: AC
  Administered 2015-09-13 (×2): 40 meq via ORAL
  Filled 2015-09-13 (×2): qty 2

## 2015-09-13 MED ORDER — MAGNESIUM SULFATE 2 GM/50ML IV SOLN
2.0000 g | Freq: Once | INTRAVENOUS | Status: AC
  Administered 2015-09-13: 2 g via INTRAVENOUS
  Filled 2015-09-13: qty 50

## 2015-09-13 NOTE — Progress Notes (Signed)
[facility] PROGRESS NOTE  Kiara Warren XLK:[id number] DOB: 08-24-1966 DOA: 09/10/2015  PCP: No primary care provider on file.  Brief HPI: 49 year old Caucasian female with a past medical history of cholelithiasis. Heavy alcohol abuse presented with complaints of abdominal distention and jaundice. She was found to have a significantly elevated bilirubin in the emergency department. She was hospitalized for further management. She was seen by gastroenterology. Ultrasound also showed portal vein thrombosis. Hematology and oncology was also consulted.  Past medical history:  Past Medical History  Diagnosis Date  . ETOH abuse   . Sciatica     Consultants: Gastroenterology, hematology and oncology  Procedures: None  Antibiotics: None  Subjective: Patient feels some better this morning. Not as bloated. Tolerating her diet better. No nausea or vomiting. Concerned about the lesion in the liver.   Objective: Vital Signs  Filed Vitals:   09/12/15 0549 09/12/15 1437 09/12/15 2222 09/13/15 0629  BP: 114/72 111/76 111/66 115/68  Pulse: 85 91 100 87  Temp: 98.1 F (36.7 C) 98.4 F (36.9 C) 98.8 F (37.1 C) 98.2 F (36.8 C)  TempSrc: Oral Oral Oral Oral  Resp: Height:      Weight:      SpO2: 97% 98% 97% 98%    Intake/Output Summary (Last 24 hours) at 09/13/15 0750 Last data filed at 09/12/15 1700  Gross per 24 hour  Intake 881.33 ml  Output      0 ml  Net 881.33 ml   Filed Weights   09/10/15 1812 09/11/15 0010  Weight: 82.963 kg (182 lb 14.4 oz) 83.462 kg (184 lb)    General appearance: alert, cooperative, appears stated age and no distress Resp: clear to auscultation bilaterally Cardio: regular rate and rhythm, S1, S2 normal, no murmur, click, rub or gallop GI: Distended abdomen, but soft. Fullness in the upper abdomen. Nontender. No obvious organomegaly on examination. Extremities: extremities normal, atraumatic, no cyanosis or  edema Neurologic: Alert and oriented 3. No focal neurological deficits noted.  Lab Results:  Basic Metabolic Panel:  Recent Labs Lab 09/10/15 2005 09/11/15 0531 09/12/15 0539  NA 131* 132* 132*  K 3.5 3.4* 3.1*  CL 93* 95* 99*  CO2 GLUCOSE 105* 118* 114*  BUN <5* 5* <5*  CREATININE 0.39* 0.47 <0.30*  CALCIUM 8.2* 7.9* 7.6*  MG 1.2*  --  1.3*  PHOS 1.7*  --   --    Liver Function Tests:  Recent Labs Lab 09/10/15 2005 09/11/15 0531 09/12/15 0539  AST 178* 137* 126*  ALT 75* 59* 52  ALKPHOS 254* 203* 214*  BILITOT 18.0* 15.8* 16.4*  PROT 6.9 5.7* 5.6*  ALBUMIN 2.1* 1.7* 1.6*    Recent Labs Lab 09/10/15 2005  LIPASE 25   CBC:  Recent Labs Lab 09/10/15 2005 09/11/15 0531 09/12/15 0539 09/13/15 0600  WBC 18.6* 13.5* 13.8* 13.7*  NEUTROABS 15.3*  --   --   --   HGB 11.3* 9.5* 9.6* 9.9*  HCT 32.4* 26.8* 27.9* 28.6*  MCV 109.5* 108.9* 109.4* 109.2*  PLT 165 131* 139* 154     Studies/Results: Ct Abdomen Pelvis W Contrast  09/12/2015  CLINICAL DATA:  Jaundice starting 09/07/2015, upper abdominal pain and swelling EXAM: CT ABDOMEN AND PELVIS WITH CONTRAST TECHNIQUE: Multidetector CT imaging of the abdomen and pelvis was performed using the standard protocol following bolus administration of intravenous contrast. CONTRAST:  85mL OMNIPAQUE IOHEXOL 350 MG/ML SOLN COMPARISON:  Abdominal ultrasound 09/11/2015 and  CT scan 04/18/2008 FINDINGS: Sagittal images of the spine shows mild degenerative changes lower thoracic spine. Heart size within normal limits.  Small hiatal hernia is noted. Hepatomegaly is noted. The liver measures 28 cm in length. There is significant fatty infiltration of the liver. Perihepatic ascites is noted. Small perisplenic ascites. There is poorly visualized low-density lesion in right hepatic lobe just above the gallbladder measures 2.3 cm. This is indeterminate. Further correlation with enhanced MRI with delay images is recommended. The  pancreas, spleen and adrenal glands are unremarkable. Kidneys are symmetrical in size and enhancement. No hydronephrosis or hydroureter. Portal vein measures 1.1 cm in diameter. No evidence of portal hypertension. There is small ascites bilateral paracolic gutter right greater than left. Moderate pelvic ascites is noted. No aortic aneurysm. Delayed renal images shows bilateral renal symmetrical excretion. Oral contrast material was given to the patient. No small bowel obstruction. No free abdominal air. There is no pericecal inflammation. The terminal ileum is unremarkable.  The appendix is not identified. The uterus and adnexa are unremarkable. No adnexal mass. Small amount of air is noted within anterior superior aspect of the urinary bladder probable post instrumentation. No destructive bony lesions are noted within pelvis. Mild pericholecystic fluid. Mild enhancement of gallbladder wall. Although this may be due to ascites cholecystitis cannot be excluded. Clinical correlation is necessary. No calcified gallstones are noted within gallbladder. No aortic aneurysm. IMPRESSION: 1. Small hiatal hernia is noted. 2. There is hepatomegaly. Significant fatty infiltration of the liver. Indeterminate low-density lesion in right hepatic lobe just above the gallbladder measures 2.3 cm. Further correlation with enhanced MRI is recommended. 3. There is perihepatic and perisplenic ascites. Ascites is noted bilateral paracolic gutters right greater than left. Moderate pelvic ascites. No free abdominal air. 4. No small bowel obstruction. 5. There is mild enhancement of the gallbladder wall. Mild pericholecystic fluid. Although this may be due to ascites cholecystitis cannot be excluded. Clinical correlation is necessary. 6. No colonic obstruction. Mild degenerative changes thoracolumbar spine. Electronically Signed   By: Natasha Mead M.D.   On: 09/12/2015 12:22   [place] Art/ven Flow Abd Pelv Doppler Limited  09/11/2015  CLINICAL  DATA:  Possible portal vein thrombus. EXAM: DUPLEX ULTRASOUND OF LIVER AND TIPS SHUNT TECHNIQUE: Color and duplex Doppler ultrasound was performed to evaluate the hepatic in-flow vessels. COMPARISON:  Ultrasound of same day. FINDINGS: Hepatic artery is patent with antegrade flow with peak systolic velocity of 168 centimeters/second. There appears to be occlusion of the proximal and middle portions of the portal vein consistent with thrombosis of indeterminate age. Retrograde flow is noted in the distal portion of the portal vein which flows into the proximal left portal vein. 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 These results will be called to the ordering clinician or representative by the Radiologist Assistant, and communication documented in the PACS or zVision Dashboard. Electronically Signed   By: Lupita Raider, M.D.   On: 09/11/2015 14:42   US Abdomen Limited Ruq  09/11/2015  CLINICAL DATA:  Hepatic failure with jaundice. EXAM: US ABDOMEN LIMITED - RIGHT UPPER QUADRANT COMPARISON:  None. FINDINGS: Gallbladder: Sludge is seen in the gallbladder. No gallstones are appreciable. The gallbladder wall is thickened and edematous. There is no well-defined pericholecystic fluid. No sonographic Murphy sign noted. Common bile duct: Diameter: 3 mm. There is no intrahepatic or extrahepatic biliary duct dilatation. Liver: No focal lesion identified. Liver is diffusely echogenic. There is  attenuated flow in the portal vein with subtle echogenic material in the main portal vein. A small amount of ascites is noted. IMPRESSION: The wall of the gallbladder is thickened and edematous. While ascites, which is present, can cause gallbladder wall thickening, the edematous appearance of the gallbladder raises concern for acalculus cholecystitis. No gallstones are seen; however, sludge is noted in the gallbladder. Liver shows diffuse increased echogenicity  consistent with hepatic steatosis, likely with underlying parenchymal liver disease given the history. Question portal vein thrombosis. Doppler signal is attenuated in the main portal vein with subtle echogenic material concerning for thrombus in this vessel. Small amount of ascites. Electronically Signed   By: Bretta BangWilliam  Woodruff III M.D.   On: 09/11/2015 08:51    Medications:  Scheduled: . cefTRIAXone (ROCEPHIN)  IV  1 g Intravenous Q24H  . enoxaparin (LOVENOX) injection  40 mg Subcutaneous Q24H  . feeding supplement (ENSURE ENLIVE)  237 mL Oral TID BM  . folic acid  1 mg Oral Daily  . multivitamin with minerals  1 tablet Oral Daily  . prednisoLONE  40 mg Oral Daily  . sodium chloride  3 mL Intravenous Q12H  . thiamine  100 mg Oral Daily   Or  . thiamine  100 mg Intravenous Daily   Continuous:  JYN:WGNFAOPRN:sodium chloride, LORazepam **OR** LORazepam, ondansetron **OR** ondansetron (ZOFRAN) IV, sodium chloride  Assessment/Plan:  Principal Problem:   Acute liver failure Active Problems:   Jaundice   Hyperbilirubinemia   Abdominal pain   ETOH abuse   Leukocytosis   Acute hepatic failure   Portal vein thrombosis   Alcoholic hepatitis with ascites    Acute liver failure with secondary hyperbilirubinemia, coagulopathy and abdominal pain Pain likely secondary to abdominal distention from mild ascites. GI is following. Patient started on prednisolone. Fullness appreciated in the epigastric area on examination. No masses detected on CT scan. Hepatomegaly was noted. A 2.3 cm liver lesion was also identified.  Liver lesion Noted on CT of the abdomen, pelvis. Unclear type, based on CT scan. Proceed with MRI of the liver.  Portal vein thrombosis Thought to be of indeterminate age, based on ultrasound report. Hematology and oncology was consulted to weigh in. Patient is already auto anticoagulation due to her liver disease. No anticoagulation per hematology/oncology.  History of alcohol  abuse This has been extensively discussed with the patient. Her last alcohol consumption was about 2-3 days prior to admission. No evidence for withdrawal symptoms currently. Continue CIWA protocol. Continue thiamine and multivitamins.  Leukocytosis/Questionable UTI Possibly secondary to stress margination. But UA noted to be abnormal. Started on Ceftriaxone. Urine cultures pending. WBC likely to remain elevated now that she is on steroids.   Hyponatremia/hypokalemia/hypomagnesemia Replace potassium and magnesium. Low sodium level, likely due to her liver disease.  Macrocytic anemia/anemia of chronic disease Hemoglobin is stable. TSH is normal. B-12 and folate levels normal. Likely due to alcoholism.   DVT Prophylaxis: Lovenox     Code Status: Full code  Family Communication: Discussed with the patient  Disposition Plan: Await MRI. Mobilize patient. Anticipate discharge tomorrow.    LOS: 3 days   [facility] Mental Health, IncKRISHNAN,Dianelys Scinto  [facility] Pager [phone] 09/13/2015, 7:50 AM  If 7PM-7AM, please contact night-coverage at [email or web address], password [email or web address]

## 2015-09-13 NOTE — Progress Notes (Signed)
Subjective: Feeling ok today. Has bloated feeling/full feeling after eating. Denies N/V, abdominal pain. Keeping food down ok.  Objective: Vital signs in last 24 hours: Temp:  [98.2 F (36.8 C)-98.8 F (37.1 C)] 98.2 F (36.8 C) (12/01 0629) Pulse Rate:  [87-100] 87 (12/01 0629) Resp:  [18] 18 (12/01 0629) BP: (111-115)/(66-76) 115/68 mmHg (12/01 0629) SpO2:  [97 %-98 %] 98 % (12/01 0629) Last BM Date: 09/12/15 General:   Alert, jaundiced, and pleasant Head:  Normocephalic and atraumatic. Eyes:  Positive icterus, sclera clear. Heart:  S1, S2 present, no murmurs noted.  Lungs: Clear to auscultation bilaterally, without wheezing, rales, or rhonchi.  Abdomen:  Bowel sounds present, rounded/full but soft, non-tender. No rebound or guarding. No masses appreciated  Pulses:  Normal DP pulses noted. Extremities:  Without clubbing or edema. Neurologic:  Alert and  oriented x4;  grossly normal neurologically. Skin:  Warm and dry, intact without significant lesions.  Psych:  Alert and cooperative. Normal mood and affect.  Intake/Output from previous day: 11/30 0701 - 12/01 0700 In: 881.3 [P.O.:360; I.V.:521.3] Out: -  Intake/Output this shift:    Lab Results:  Recent Labs  09/11/15 0531 09/12/15 0539 09/13/15 0600  WBC 13.5* 13.8* 13.7*  HGB 9.5* 9.6* 9.9*  HCT 26.8* 27.9* 28.6*  PLT 131* 139* 154   BMET  Recent Labs  09/11/15 0531 09/12/15 0539 09/13/15 0600  NA 132* 132* 134*  K 3.4* 3.1* 3.3*  CL 95* 99* 101  CO2 _0 GLUCOSE 118* 114* 126*  BUN 5* <5* <5*  CREATININE 0.47 <0.30* 0.34*  CALCIUM 7.9* 7.6* 7.9*   LFT  Recent Labs  09/10/15 2005 09/11/15 0531 09/12/15 0539 09/13/15 0600  PROT 6.9 5.7* 5.6* 5.5*  ALBUMIN 2.1* 1.7* 1.6* 1.6*  AST 178* 137* 126* 101*  ALT 75* 59* 52 46  ALKPHOS 254* 203* 214* 209*  BILITOT 18.0* 15.8* 16.4* 17.3*  BILIDIR 10.1*  --   --   --   IBILI 7.9*  --   --   --    PT/INR  Recent Labs  09/12/15 0539  09/13/15 0600  LABPROT 23.5* 24.7*  INR 2.11* 2.25*   Hepatitis Panel  Recent Labs  09/10/15 2005  HEPBSAG Negative  HCVAB <0.1  HEPAIGM Negative  HEPBIGM Negative    Studies/Results: Ct Abdomen Pelvis W Contrast  09/12/2015  CLINICAL DATA:  Jaundice starting 09/07/2015, upper abdominal pain and swelling EXAM: CT ABDOMEN AND PELVIS WITH CONTRAST TECHNIQUE: Multidetector CT imaging of the abdomen and pelvis was performed using the standard protocol following bolus administration of intravenous contrast. CONTRAST:  27m OMNIPAQUE IOHEXOL 350 MG/ML SOLN COMPARISON:  Abdominal ultrasound 09/11/2015 and CT scan 04/18/2008 FINDINGS: Sagittal images of the spine shows mild degenerative changes lower thoracic spine. Heart size within normal limits.  Small hiatal hernia is noted. Hepatomegaly is noted. The liver measures 28 cm in length. There is significant fatty infiltration of the liver. Perihepatic ascites is noted. Small perisplenic ascites. There is poorly visualized low-density lesion in right hepatic lobe just above the gallbladder measures 2.3 cm. This is indeterminate. Further correlation with enhanced MRI with delay images is recommended. The pancreas, spleen and adrenal glands are unremarkable. Kidneys are symmetrical in size and enhancement. No hydronephrosis or hydroureter. Portal vein measures 1.1 cm in diameter. No evidence of portal hypertension. There is small ascites bilateral paracolic gutter right greater than left. Moderate pelvic ascites is noted. No aortic aneurysm. Delayed renal images shows bilateral renal  symmetrical excretion. Oral contrast material was given to the patient. No small bowel obstruction. No free abdominal air. There is no pericecal inflammation. The terminal ileum is unremarkable.  The appendix is not identified. The uterus and adnexa are unremarkable. No adnexal mass. Small amount of air is noted within anterior superior aspect of the urinary bladder probable  post instrumentation. No destructive bony lesions are noted within pelvis. Mild pericholecystic fluid. Mild enhancement of gallbladder wall. Although this may be due to ascites cholecystitis cannot be excluded. Clinical correlation is necessary. No calcified gallstones are noted within gallbladder. No aortic aneurysm. IMPRESSION: 1. Small hiatal hernia is noted. 2. There is hepatomegaly. Significant fatty infiltration of the liver. Indeterminate low-density lesion in right hepatic lobe just above the gallbladder measures 2.3 cm. Further correlation with enhanced MRI is recommended. 3. There is perihepatic and perisplenic ascites. Ascites is noted bilateral paracolic gutters right greater than left. Moderate pelvic ascites. No free abdominal air. 4. No small bowel obstruction. 5. There is mild enhancement of the gallbladder wall. Mild pericholecystic fluid. Although this may be due to ascites cholecystitis cannot be excluded. Clinical correlation is necessary. 6. No colonic obstruction. Mild degenerative changes thoracolumbar spine. Electronically Signed   By: Lahoma Crocker M.D.   On: 09/12/2015 12:22   [place] Art/ven Flow Abd Pelv Doppler Limited  09/11/2015  CLINICAL DATA:  Possible portal vein thrombus. EXAM: DUPLEX ULTRASOUND OF LIVER AND TIPS SHUNT TECHNIQUE: Color and duplex Doppler ultrasound was performed to evaluate the hepatic in-flow vessels. COMPARISON:  Ultrasound of same day. FINDINGS: Hepatic artery is patent with antegrade flow with peak systolic velocity of 161 centimeters/second. There appears to be occlusion of the proximal and middle portions of the portal vein consistent with thrombosis of indeterminate age. Retrograde flow is noted in the distal portion of the portal vein which flows into the proximal left portal vein. 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 These results will be called to the ordering clinician or representative  by the Radiologist Assistant, and communication documented in the PACS or zVision Dashboard. Electronically Signed   By: Marijo Conception, M.D.   On: 09/11/2015 14:42    Assessment: 49 year old female admitted with likely acute alcoholic hepatitis, cannot rule out chronic underlying liver disease in setting of acute liver failure. Mild improvement in LFTs from admission. Viral markers negative. No obvious signs/symptoms of infection and leukocytosis is improving. Question reactive. Small amount of ascites on [place] but doubt SBP. Portal Vein Thrombosis as noted in setting of INR >2. No anticoagulation recommended at this time per hematology. Discussed at length with patient the concern for mortality if persistent ETOH use.   DF 69. Hep C Ab negative. Prednisolone started yesterday.  CT today showed hepatomegaly with ascites but no evidence of portal hypertension. Significant fatty liver. Indeterminate low-density lesion in right hepatic lobe just above gallbladder measures 2.3 cm, need MRI follow-up.   Today, autoimmune serologies resulted and negative, including ANA, mitochondrial Ab, anti-smooth muscle Ab. H/H stable, plt improved slightly, Bili increased to 17.3, AST improved, Alk Phos stable/elevated at 209. Renal function good. INR 2.25.  MELD: 28; Child-Pugh: C; DF: 61. Postprandial abdominal fullness could be related to possible underlying chronic liver disease and delayed gastric emptying as well as mild noted ascites on cross-sectional imaging and U/S. Could consider GES as outpatient if symptoms persist when acute situation has resolved.   Plan: 1. MRI ordered already for today to  further evaluate low density right hepatic lobe lesion on CT 2. CBC, CMP already ordered for tomorrow. 3. Continue prednisolone 4. ETOH cessation 5. Monitor liver function, DF, and Lille score (at day 7) for improvement over the next week for effectiveness of prednisolone 6. Continue supportive measures 7. Possible  GES as outpatient for postprandial prolonged fullness if remains symptomatic after d/c.   Walden Field, AGNP-C Adult & Gerontological Nurse Practitioner [facility]    LOS: 3 days    09/13/2015, 9:34 AM

## 2015-09-14 ENCOUNTER — Telehealth: Payer: Self-pay | Admitting: Nurse Practitioner

## 2015-09-14 LAB — CBC
HEMATOCRIT: 29.1 % — AB (ref 36.0–46.0)
HEMOGLOBIN: 9.9 g/dL — AB (ref 12.0–15.0)
MCH: 37.5 pg — AB (ref 26.0–34.0)
MCHC: 34 g/dL (ref 30.0–36.0)
MCV: 110.2 fL — ABNORMAL HIGH (ref 78.0–100.0)
Platelets: 171 10*3/uL (ref 150–400)
RBC: 2.64 MIL/uL — ABNORMAL LOW (ref 3.87–5.11)
RDW: 16.9 % — ABNORMAL HIGH (ref 11.5–15.5)
WBC: 15.2 10*3/uL — ABNORMAL HIGH (ref 4.0–10.5)

## 2015-09-14 LAB — COMPREHENSIVE METABOLIC PANEL
ALK PHOS: 205 U/L — AB (ref 38–126)
ALT: 44 U/L (ref 14–54)
ANION GAP: 6 (ref 5–15)
AST: 83 U/L — ABNORMAL HIGH (ref 15–41)
Albumin: 1.6 g/dL — ABNORMAL LOW (ref 3.5–5.0)
BILIRUBIN TOTAL: 16.3 mg/dL — AB (ref 0.3–1.2)
BUN: 7 mg/dL (ref 6–20)
CALCIUM: 8.1 mg/dL — AB (ref 8.9–10.3)
CO2: 28 mmol/L (ref 22–32)
Chloride: 101 mmol/L (ref 101–111)
Creatinine, Ser: 0.32 mg/dL — ABNORMAL LOW (ref 0.44–1.00)
GFR calc non Af Amer: >60 mL/min (ref >60)
Glucose, Bld: 95 mg/dL (ref 65–99)
Potassium: 4.1 mmol/L (ref 3.5–5.1)
Sodium: 135 mmol/L (ref 135–145)
TOTAL PROTEIN: 5.6 g/dL — AB (ref 6.5–8.1)

## 2015-09-14 LAB — MAGNESIUM: Magnesium: 2 mg/dL (ref 1.7–2.4)

## 2015-09-14 MED ORDER — PREDNISOLONE 5 MG PO TABS: 40.0000 mg | ORAL_TABLET | Freq: Every day | ORAL | Status: DC

## 2015-09-14 MED ORDER — ADULT MULTIVITAMIN W/MINERALS CH: 1.0000 | ORAL_TABLET | Freq: Every day | ORAL | Status: AC

## 2015-09-14 MED ORDER — CEPHALEXIN 500 MG PO CAPS
500.0000 mg | ORAL_CAPSULE | Freq: Three times a day (TID) | ORAL | Status: DC
  Administered 2015-09-14 (×2): 500 mg via ORAL
  Filled 2015-09-14 (×2): qty 1

## 2015-09-14 MED ORDER — THIAMINE HCL 100 MG PO TABS: 100.0000 mg | ORAL_TABLET | Freq: Every day | ORAL | Status: AC

## 2015-09-14 MED ORDER — CEPHALEXIN 500 MG PO CAPS: 500.0000 mg | ORAL_CAPSULE | Freq: Three times a day (TID) | ORAL | Status: DC

## 2015-09-14 NOTE — Progress Notes (Signed)
Patient to need outpatient GI follow-up in 1-2 weeks. Note sent to office staff to schedule. Very important to keep this appointment.

## 2015-09-14 NOTE — Telephone Encounter (Signed)
Patient expected d/c from hospital today. Please schedule OPV in 1-2 weeks.

## 2015-09-14 NOTE — Discharge Summary (Signed)
[facility]  Physician Discharge Summary   Patient ID: Kiara Warren MRN: [id number] DOB/AGE: Mar 13, 1966 49 y.o.  Admit date: 09/10/2015 Discharge date: 09/14/2015  PCP: No primary care provider on file.  DISCHARGE DIAGNOSES:  Principal Problem:   Acute liver failure Active Problems:   Jaundice   Hyperbilirubinemia   Abdominal pain   ETOH abuse   Leukocytosis   Acute hepatic failure   Portal vein thrombosis   Alcoholic hepatitis with ascites   Liver lesion   Hypokalemia   RECOMMENDATIONS FOR OUTPATIENT FOLLOW UP: 1. Patient will have close follow-up with GI.  DISCHARGE CONDITION: fair  Diet recommendation: Low-sodium  Filed Weights   09/10/15 1812 09/11/15 0010  Weight: 82.963 kg (182 lb 14.4 oz) 83.462 kg (184 lb)    INITIAL HISTORY: 49 year old Caucasian female with a past medical history of cholelithiasis. Heavy alcohol abuse presented with complaints of abdominal distention and jaundice. She was found to have a significantly elevated bilirubin in the emergency department. She was hospitalized for further management. She was seen by gastroenterology. Ultrasound also showed portal vein thrombosis. Hematology and oncology was also consulted.  Consultations: Gastroenterology, hematology and oncology   HOSPITAL COURSE:   Acute liver failure with secondary hyperbilirubinemia, coagulopathy and abdominal pain Patient liver disease was secondary to heavy alcohol abuse. Imaging studies suggested steatohepatitis without any clear evidence of cirrhosis. She was noted to have significantly high bilirubin. Patient was seen by gastroenterology. She was started on prednisolone. She also underwent CT scan of the abdomen and pelvis as mentioned below. 2.3 cm liver lesion was identified as discussed below. Gastroenterology will follow the patient in their office next week. Abdominal pain thought to be secondary to the above and has improved. LFTs have improved.  Bilirubin remains elevated. This will be followed up in the gastroenterology office.  Liver lesion Noted on CT of the abdomen, pelvis. Unclear type, based on CT scan. MRI of the liver was done subsequently. This revealed focal superimposed fat. It was what was detected on CT scan. No masses were identified.   Portal vein thrombosis This was detected on ultrasound. Thought to be of indeterminate age, based on ultrasound report. Hematology and oncology was consulted to weigh in. Patient is already auto anticoagulation due to her liver disease. No anticoagulation per hematology/oncology.  History of alcohol abuse This has been extensively discussed with the patient. Her last alcohol consumption was about 2-3 days prior to admission. No evidence for withdrawal symptoms during this hospitalization. She was placed on CIWA protocol. Continue thiamine and multivitamins.  Leukocytosis/Questionable UTI Possibly secondary to stress margination. But UA noted to be abnormal. Started on Ceftriaxone. Urine cultures did not show any growth. WBC likely to remain elevated now that she is on steroids. She'll be discharged on Keflex.  Hyponatremia/hypokalemia/hypomagnesemia Replaced potassium and magnesium. Low sodium level, likely due to her liver disease. Improved.  Macrocytic anemia/anemia of chronic disease Hemoglobin is stable. TSH is normal. B-12 and folate levels normal. Likely due to alcoholism.  Overall, stable. Okay for discharge today. Patient requested that I talk to her cousin who is a physician. I did call Dr. Ames Coupe [phone]. Answered all her questions.    PERTINENT LABS:  The results of significant diagnostics from this hospitalization (including imaging, microbiology, ancillary and laboratory) are listed below for reference.    Microbiology: Recent Results (from the past 240 hour(s))  Culture, Urine     Status: None   Collection Time: 09/12/15  6:30 PM  Result Value Ref  Range  Status   Specimen Description URINE, CLEAN CATCH  Final   Special Requests NONE  Final   Culture   Final    NO GROWTH 1 DAY Performed at [facility]    Report Status 09/13/2015 FINAL  Final     Labs: Basic Metabolic Panel:  Recent Labs Lab 09/10/15 2005 09/11/15 0531 09/12/15 0539 09/13/15 0600 09/14/15 0538  NA 131* 132* 132* 134* 135  K 3.5 3.4* 3.1* 3.3* 4.1  CL 93* 95* 99* 101 101  CO2 26 28 26 28 28   GLUCOSE 105* 118* 114* 126* 95  BUN <5* 5* <5* <5* 7  CREATININE 0.39* 0.47 <0.30* 0.34* 0.32*  CALCIUM 8.2* 7.9* 7.6* 7.9* 8.1*  MG 1.2*  --  1.3* 1.5* 2.0  PHOS 1.7*  --   --   --   --    Liver Function Tests:  Recent Labs Lab 09/10/15 2005 09/11/15 0531 09/12/15 0539 09/13/15 0600 09/14/15 0538  AST 178* 137* 126* 101* 83*  ALT 75* 59* 52 46 44  ALKPHOS 254* 203* 214* 209* 205*  BILITOT 18.0* 15.8* 16.4* 17.3* 16.3*  PROT 6.9 5.7* 5.6* 5.5* 5.6*  ALBUMIN 2.1* 1.7* 1.6* 1.6* 1.6*    Recent Labs Lab 09/10/15 2005  LIPASE 25   No results for input(s): AMMONIA in the last 168 hours. CBC:  Recent Labs Lab 09/10/15 2005 09/11/15 0531 09/12/15 0539 09/13/15 0600 09/14/15 0538  WBC 18.6* 13.5* 13.8* 13.7* 15.2*  NEUTROABS 15.3*  --   --   --   --   HGB 11.3* 9.5* 9.6* 9.9* 9.9*  HCT 32.4* 26.8* 27.9* 28.6* 29.1*  MCV 109.5* 108.9* 109.4* 109.2* 110.2*  PLT 165 131* 139* 154 171    IMAGING STUDIES Dg Chest 2 View  09/10/2015  CLINICAL DATA:  49 year old female with jaundice and abdominal pain and swelling. EXAM: CHEST  2 VIEW COMPARISON:  None. FINDINGS: The cardiomediastinal silhouette is unremarkable. There is no evidence of focal airspace disease, pulmonary edema, suspicious pulmonary nodule/mass, pleural effusion, or pneumothorax. No acute bony abnormalities are identified. IMPRESSION: No active cardiopulmonary disease. Electronically Signed   By: Harmon Pier M.D.   On: 09/10/2015 18:46   Ct Abdomen Pelvis W Contrast  09/12/2015   CLINICAL DATA:  Jaundice starting 09/07/2015, upper abdominal pain and swelling EXAM: CT ABDOMEN AND PELVIS WITH CONTRAST TECHNIQUE: Multidetector CT imaging of the abdomen and pelvis was performed using the standard protocol following bolus administration of intravenous contrast. CONTRAST:  85mL OMNIPAQUE IOHEXOL 350 MG/ML SOLN COMPARISON:  Abdominal ultrasound 09/11/2015 and CT scan 04/18/2008 FINDINGS: Sagittal images of the spine shows mild degenerative changes lower thoracic spine. Heart size within normal limits.  Small hiatal hernia is noted. Hepatomegaly is noted. The liver measures 28 cm in length. There is significant fatty infiltration of the liver. Perihepatic ascites is noted. Small perisplenic ascites. There is poorly visualized low-density lesion in right hepatic lobe just above the gallbladder measures 2.3 cm. This is indeterminate. Further correlation with enhanced MRI with delay images is recommended. The pancreas, spleen and adrenal glands are unremarkable. Kidneys are symmetrical in size and enhancement. No hydronephrosis or hydroureter. Portal vein measures 1.1 cm in diameter. No evidence of portal hypertension. There is small ascites bilateral paracolic gutter right greater than left. Moderate pelvic ascites is noted. No aortic aneurysm. Delayed renal images shows bilateral renal symmetrical excretion. Oral contrast material was given to the patient. No small bowel obstruction. No free abdominal air. There is no  pericecal inflammation. The terminal ileum is unremarkable.  The appendix is not identified. The uterus and adnexa are unremarkable. No adnexal mass. Small amount of air is noted within anterior superior aspect of the urinary bladder probable post instrumentation. No destructive bony lesions are noted within pelvis. Mild pericholecystic fluid. Mild enhancement of gallbladder wall. Although this may be due to ascites cholecystitis cannot be excluded. Clinical correlation is necessary. No  calcified gallstones are noted within gallbladder. No aortic aneurysm. IMPRESSION: 1. Small hiatal hernia is noted. 2. There is hepatomegaly. Significant fatty infiltration of the liver. Indeterminate low-density lesion in right hepatic lobe just above the gallbladder measures 2.3 cm. Further correlation with enhanced MRI is recommended. 3. There is perihepatic and perisplenic ascites. Ascites is noted bilateral paracolic gutters right greater than left. Moderate pelvic ascites. No free abdominal air. 4. No small bowel obstruction. 5. There is mild enhancement of the gallbladder wall. Mild pericholecystic fluid. Although this may be due to ascites cholecystitis cannot be excluded. Clinical correlation is necessary. 6. No colonic obstruction. Mild degenerative changes thoracolumbar spine. Electronically Signed   By: Natasha MeadLiviu  Pop M.D.   On: 09/12/2015 12:22   Mr Liver W Wo Contrast  09/13/2015  CLINICAL DATA:  Upper abdominal pain, nausea, jaundice. Evaluate liver lesion on CT. EXAM: MRI ABDOMEN WITHOUT AND WITH CONTRAST TECHNIQUE: Multiplanar multisequence MR imaging of the abdomen was performed both before and after the administration of intravenous contrast. CONTRAST:  8 mL Eovist IV COMPARISON:  CT abdomen pelvis dated 09/12/2015 FINDINGS: Motion degraded images. Lower chest:  Trace bilateral pleural effusions. Hepatobiliary: Hepatomegaly. Heterogeneous/geographic areas of severe hepatic steatosis. 2.1 x 1.5 cm hypoenhancing lesion along the posterior aspect of the medial segment left hepatic lobe (series 5005/image 43), corresponding to the CT abnormality, with severe signal loss on opposed phase imaging (series 6/image 32), reflecting an area of superimposed focal fat. No suspicious/enhancing hepatic lesions. Mild pericholecystic fluid, favored to be secondary to upper abdominal ascites. No gallbladder wall thickening or gallbladder distention. No intrahepatic or extrahepatic ductal dilatation. No definite  cholelithiasis or choledocholithiasis. Pancreas: Within normal limits. Spleen: Within normal limits. Adrenals/Urinary Tract: Adrenal glands are within normal limits. Kidneys within normal limits.  No hydronephrosis. Stomach/Bowel: Stomach is within normal limits. Visualized bowel is unremarkable. Vascular/Lymphatic: No evidence abdominal aortic aneurysm. No suspicious abdominal lymphadenopathy. Other: Small volume abdominal ascites, most prominent along the liver and bilateral pericolic gutters. Musculoskeletal: No focal osseous lesions. IMPRESSION: Hepatomegaly with severe hepatic steatosis. In the setting of abnormal LFTs, correlate for steatohepatitis. Superimposed focal fat in the medial segment left hepatic lobe, corresponding to the CT abnormality. No suspicious/enhancing hepatic lesions. No cholelithiasis or choledocholithiasis is visualized. No intrahepatic or extrahepatic ductal dilatation. Small volume upper abdominal ascites. Trace bilateral pleural effusions. Electronically Signed   By: Charline BillsSriyesh  Alexsander Cavins M.D.   On: 09/13/2015 12:45   [place] Art/ven Flow Abd Pelv Doppler Limited  09/11/2015  CLINICAL DATA:  Possible portal vein thrombus. EXAM: DUPLEX ULTRASOUND OF LIVER AND TIPS SHUNT TECHNIQUE: Color and duplex Doppler ultrasound was performed to evaluate the hepatic in-flow vessels. COMPARISON:  Ultrasound of same day. FINDINGS: Hepatic artery is patent with antegrade flow with peak systolic velocity of 168 centimeters/second. There appears to be occlusion of the proximal and middle portions of the portal vein consistent with thrombosis of indeterminate age. Retrograde flow is noted in the distal portion of the portal vein which flows into the proximal left portal vein. IMPRESSION: Thrombosis of the proximal and middle portion of the  portal vein is noted of indeterminate age. These results will be called to the ordering clinician or representative by the Radiologist Assistant, and communication  documented in the PACS or zVision Dashboard. Electronically Signed   By: Lupita Raider, M.D.   On: 09/11/2015 14:42   US Abdomen Limited Ruq  09/11/2015  CLINICAL DATA:  Hepatic failure with jaundice. EXAM: US ABDOMEN LIMITED - RIGHT UPPER QUADRANT COMPARISON:  None. FINDINGS: Gallbladder: Sludge is seen in the gallbladder. No gallstones are appreciable. The gallbladder wall is thickened and edematous. There is no well-defined pericholecystic fluid. No sonographic Murphy sign noted. Common bile duct: Diameter: 3 mm. There is no intrahepatic or extrahepatic biliary duct dilatation. Liver: No focal lesion identified. Liver is diffusely echogenic. There is attenuated flow in the portal vein with subtle echogenic material in the main portal vein. A small amount of ascites is noted. IMPRESSION: The wall of the gallbladder is thickened and edematous. While ascites, which is present, can cause gallbladder wall thickening, the edematous appearance of the gallbladder raises concern for acalculus cholecystitis. No gallstones are seen; however, sludge is noted in the gallbladder. Liver shows diffuse increased echogenicity consistent with hepatic steatosis, likely with underlying parenchymal liver disease given the history. Question portal vein thrombosis. Doppler signal is attenuated in the main portal vein with subtle echogenic material concerning for thrombus in this vessel. Small amount of ascites. Electronically Signed   By: Bretta Bang III M.D.   On: 09/11/2015 08:51    DISCHARGE EXAMINATION: Filed Vitals:   09/13/15 0629 09/13/15 1450 09/13/15 2141 09/14/15 0422  BP: 115/68 109/83 118/72 109/62  Pulse: 87 77 70 97  Temp: 98.2 F (36.8 C) 98.6 F (37 C) 98.4 F (36.9 C) 98.2 F (36.8 C)  TempSrc: Oral Oral Oral Oral  Resp: Height:      Weight:      SpO2: 98% 97% 98% 99%   General appearance: alert, cooperative, appears stated age and no distress Resp: clear to auscultation  bilaterally Cardio: regular rate and rhythm, S1, S2 normal, no murmur, click, rub or gallop GI: Mildly distended abdomen. Soft. Nontender. Extremities: extremities normal, atraumatic, no cyanosis or edema  DISPOSITION: Home  Discharge Instructions    Call MD for:  difficulty breathing, headache or visual disturbances    Complete by:  As directed      Call MD for:  extreme fatigue    Complete by:  As directed      Call MD for:  hives    Complete by:  As directed      Call MD for:  persistant dizziness or light-headedness    Complete by:  As directed      Call MD for:  persistant nausea and vomiting    Complete by:  As directed      Call MD for:  severe uncontrolled pain    Complete by:  As directed      Call MD for:  temperature >100.4    Complete by:  As directed      Diet - low sodium heart healthy    Complete by:  As directed      Discharge instructions    Complete by:  As directed   Please be sure to keep your follow up appointments. Please stop drinking alcohol. Please take your medications as prescribed.  You were cared for by a hospitalist during your hospital stay. If you have any questions about your discharge medications or the  care you received while you were in the hospital after you are discharged, you can call the unit and asked to speak with the hospitalist on call if the hospitalist that took care of you is not available. Once you are discharged, your primary care physician will handle any further medical issues. Please note that NO REFILLS for any discharge medications will be authorized once you are discharged, as it is imperative that you return to your primary care physician (or establish a relationship with a primary care physician if you do not have one) for your aftercare needs so that they can reassess your need for medications and monitor your lab values. If you do not have a primary care physician, you can call [phone] for a physician referral.     Increase  activity slowly    Complete by:  As directed            ALLERGIES: No Known Allergies   Current Discharge Medication List    START taking these medications   Details  cephALEXin (KEFLEX) 500 MG capsule Take 1 capsule (500 mg total) by mouth every 8 (eight) hours. For 4 more days Qty: 12 capsule, Refills: 0    Multiple Vitamin (MULTIVITAMIN WITH MINERALS) TABS tablet Take 1 tablet by mouth daily. Qty: 30 tablet, Refills: 3    prednisoLONE 5 MG TABS tablet Take 8 tablets (40 mg total) by mouth daily. Qty: 90 each, Refills: 0    thiamine 100 MG tablet Take 1 tablet (100 mg total) by mouth daily. Qty: 30 tablet, Refills: 3       Follow-up Information    Follow up with Rondel Baton On 09/20/2015.   Why:  @ 1130am -bring application and proof of household income   Contact information:   [address] [place] [address] [phone]       Follow up with Eula Listen, MD On 10/02/2015.   Specialty:  Gastroenterology   Why:  His office will call to schedule appointment APPT ON DEC 20,2016 AT 8:00   Contact information:   [address] [place] [address] [phone]       TOTAL DISCHARGE TIME: 35 minutes  [facility]  [facility] Pager [phone]  09/14/2015, 1:33 PM

## 2015-09-14 NOTE — Discharge Instructions (Signed)
Alcoholic Liver Disease The liver is an organ that converts food into energy, absorbs vitamins from food, removes toxins from the blood, and makes proteins. Alcoholic liver disease happens when the liver becomes damaged due to alcohol consumption and it stops working properly. CAUSES This condition is caused by drinking too much alcohol over a number of years. RISK FACTORS This condition is more likely to develop in:  Women.  People who have a family history of the disease.  People who have poor nutrition.  People who are obese. SYMPTOMS Early symptoms of this condition include:  Fatigue.  Weakness.  Abdominal discomfort on the upper right side. Symptoms of moderate disease include:  Fever.  Yellow, pale, or darkening skin.  Abdominal pain.  Nausea and vomiting.  Weight loss.  Loss of appetite. Symptoms of advanced disease include:  Abdominal swelling.  Nosebleeds or bleeding gums.  Itchy skin.  Enlarged fingertips (clubbing).  Light-colored stools that smell very bad (steatorrhea).  Mood changes.  Feeling agitated.  Confusion.  Trouble concentrating. Some people do not have symptoms until the condition becomes severe. Symptoms are often worse after heavy drinking. DIAGNOSIS This condition is diagnosed with:  A physical exam.  Blood tests.  Taking a sample of liver tissue to examine under a microscope (liver biopsy). You may also have other tests, including:   X-rays.  Ultrasound. TREATMENT Treatment may include:  Stopping alcohol use to allow the liver to heal.  Joining a support group or meeting with a counselor.  Medicines to reduce inflammation. These may be recommended if the disease is severe.  A liver transplant. This is the only treatment if the disease is very severe.  Nutritional therapy. This may involve:  Taking vitamins.  Eating foods that are high in thiamine, such as whole-wheat cereals, pork, and raw  vegetables.  Eating foods that have a lot of folic acid, such as vegetables, fruits, meats, beans, nuts, and dairy foods.  Eating a diet that includes carbohydrate-rich foods, such as yogurt, beans, potatoes, and rice. HOME CARE INSTRUCTIONS  Do not drink alcohol.  Take medicines only as directed by your health care provider.  Take vitamins only as directed by your health care provider.  Follow any diet instructions that are given to you by your health care provider. SEEK MEDICAL CARE IF:  You have a fever.  You have shortness of breath or have difficulty breathing.  You have bright red blood in your stool, or you have black, tarry stools.  You are vomiting blood.  Your skin color becomes more yellow, pale, or dark.  You develop headaches.  You have trouble thinking.  You have problems balancing or walking.   This information is not intended to replace advice given to you by your health care provider. Make sure you discuss any questions you have with your health care provider.   Document Released: 10/20/2014 Document Reviewed: 10/20/2014 Elsevier Interactive Patient Education 2016 [employer].   Alcohol Use Disorder Alcohol use disorder is a mental disorder. It is not a one-time incident of heavy drinking. Alcohol use disorder is the excessive and uncontrollable use of alcohol over time that leads to problems with functioning in one or more areas of daily living. People with this disorder risk harming themselves and others when they drink to excess. Alcohol use disorder also can cause other mental disorders, such as mood and anxiety disorders, and serious physical problems. People with alcohol use disorder often misuse other drugs.  Alcohol use disorder is common and  widespread. Some people with this disorder drink alcohol to cope with or escape from negative life events. Others drink to relieve chronic pain or symptoms of mental illness. People with a family history of  alcohol use disorder are at higher risk of losing control and using alcohol to excess.  Drinking too much alcohol can cause injury, accidents, and health problems. One drink can be too much when you are:  Working.  Pregnant or breastfeeding.  Taking medicines. Ask your doctor.  Driving or planning to drive. SYMPTOMS  Signs and symptoms of alcohol use disorder may include the following:   Consumption ofalcohol inlarger amounts or over a longer period of time than intended.  Multiple unsuccessful attempts to cutdown or control alcohol use.   A great deal of time spent obtaining alcohol, using alcohol, or recovering from the effects of alcohol (hangover).  A strong desire or urge to use alcohol (cravings).   Continued use of alcohol despite problems at work, school, or home because of alcohol use.   Continued use of alcohol despite problems in relationships because of alcohol use.  Continued use of alcohol in situations when it is physically hazardous, such as driving a car.  Continued use of alcohol despite awareness of a physical or psychological problem that is likely related to alcohol use. Physical problems related to alcohol use can involve the brain, heart, liver, stomach, and intestines. Psychological problems related to alcohol use include intoxication, depression, anxiety, psychosis, delirium, and dementia.   The need for increased amounts of alcohol to achieve the same desired effect, or a decreased effect from the consumption of the same amount of alcohol (tolerance).  Withdrawal symptoms upon reducing or stopping alcohol use, or alcohol use to reduce or avoid withdrawal symptoms. Withdrawal symptoms include:  Racing heart.  Hand tremor.  Difficulty sleeping.  Nausea.  Vomiting.  Hallucinations.  Restlessness.  Seizures. DIAGNOSIS Alcohol use disorder is diagnosed through an assessment by your health care provider. Your health care provider may start  by asking three or four questions to screen for excessive or problematic alcohol use. To confirm a diagnosis of alcohol use disorder, at least two symptoms must be present within a 3459-month period. The severity of alcohol use disorder depends on the number of symptoms:  Mild--two or three.  Moderate--four or five.  Severe--six or more. Your health care provider may perform a physical exam or use results from lab tests to see if you have physical problems resulting from alcohol use. Your health care provider may refer you to a mental health professional for evaluation. TREATMENT  Some people with alcohol use disorder are able to reduce their alcohol use to low-risk levels. Some people with alcohol use disorder need to quit drinking alcohol. When necessary, mental health professionals with specialized training in substance use treatment can help. Your health care provider can help you decide how severe your alcohol use disorder is and what type of treatment you need. The following forms of treatment are available:   Detoxification. Detoxification involves the use of prescription medicines to prevent alcohol withdrawal symptoms in the first week after quitting. This is important for people with a history of symptoms of withdrawal and for heavy drinkers who are likely to have withdrawal symptoms. Alcohol withdrawal can be dangerous and, in severe cases, cause death. Detoxification is usually provided in a hospital or in-patient substance use treatment facility.  Counseling or talk therapy. Talk therapy is provided by substance use treatment counselors. It addresses the reasons people  use alcohol and ways to keep them from drinking again. The goals of talk therapy are to help people with alcohol use disorder find healthy activities and ways to cope with life stress, to identify and avoid triggers for alcohol use, and to handle cravings, which can cause relapse.  Medicines.Different medicines can help  treat alcohol use disorder through the following actions:  Decrease alcohol cravings.  Decrease the positive reward response felt from alcohol use.  Produce an uncomfortable physical reaction when alcohol is used (aversion therapy).  Support groups. Support groups are run by people who have quit drinking. They provide emotional support, advice, and guidance. These forms of treatment are often combined. Some people with alcohol use disorder benefit from intensive combination treatment provided by specialized substance use treatment centers. Both inpatient and outpatient treatment programs are available.   This information is not intended to replace advice given to you by your health care provider. Make sure you discuss any questions you have with your health care provider.   Document Released: 11/06/2004 Document Revised: 10/20/2014 Document Reviewed: 01/06/2013 Elsevier Interactive Patient Education [employer]

## 2015-09-14 NOTE — Progress Notes (Signed)
AVS reviewed with patient.  Verbalized understanding of discharge instructions, physician follow-up, medications.  Patient verbalized understanding of low-sodium diet.  Educated on stop drinking the tomato juice and choose low-sodium options.  Patient had large container in room of tomato juice in room.  Patient has +3 BLE edema.  Dr. Rito EhrlichKrishnan notified earlier.  Stated that he "did not want to add anything else on top of what she is taking.  Patient will be followed closely as outpatient my GI, hematology, PCP."  Patient educated on keeping lower extremities elevated.  Verbalized understanding.  Directions provided to patient for GI and hematology offices.  Patient's IV removed.  Site WNL.  Patient transported by NT via w/c to main entrance for discharge. Patient stable at time of discharge.

## 2015-09-14 NOTE — Telephone Encounter (Signed)
MADE HER APPOINTMENT AND NOTIFIED NURSE ON 300 TO LET PATIENT KNOW

## 2015-09-14 NOTE — Care Management Note (Signed)
Case Management Note  Patient Details  Name: Eloise LevelsLaura J Herrada MRN: [id number] Date of Birth: 02/19/1966  Pt discharging home today with self care. [facility] per pt's choice of provider. Pt given application and clinic brochure. Pt given MATCH voucher. Pt has filled out application for assistance with the FC.  Expected Discharge Date:    09/14/2015              Expected Discharge Plan:  Home/Self Care  In-House Referral:  Financial Counselor  Discharge planning Services  CM Consult, Follow-[facility] appt scheduled, [facility]  Post Acute Care Choice:  NA Choice offered to:  NA  DME Arranged:    DME Agency:     HH Arranged:    HH Agency:     Status of Service:  Completed, signed off  Medicare Important Message Given:    Date Medicare IM Given:    Medicare IM give by:    Date Additional Medicare IM Given:    Additional Medicare Important Message give by:     If discussed at Long Length of Stay Meetings, dates discussed:    Additional Comments:  Malcolm MetroChildress, Breken Nazari Demske, RN 09/14/2015, 1:50 PM

## 2015-10-02 ENCOUNTER — Emergency Department (HOSPITAL_COMMUNITY)
Admission: EM | Admit: 2015-10-02 | Discharge: 2015-10-02 | Disposition: A | Discharge status: Home / Self Care | Payer: Self-pay | Attending: Emergency Medicine | Admitting: Emergency Medicine

## 2015-10-02 ENCOUNTER — Emergency Department (HOSPITAL_COMMUNITY): Payer: Self-pay

## 2015-10-02 ENCOUNTER — Telehealth: Payer: Self-pay

## 2015-10-02 ENCOUNTER — Other Ambulatory Visit: Payer: Self-pay

## 2015-10-02 ENCOUNTER — Encounter (HOSPITAL_COMMUNITY): Payer: Self-pay | Admitting: Emergency Medicine

## 2015-10-02 ENCOUNTER — Ambulatory Visit: Payer: Self-pay | Admitting: Nurse Practitioner

## 2015-10-02 ENCOUNTER — Telehealth: Payer: Self-pay | Admitting: Internal Medicine

## 2015-10-02 DIAGNOSIS — R0602 Shortness of breath: Secondary | ICD-10-CM | POA: Insufficient documentation

## 2015-10-02 DIAGNOSIS — Z8659 Personal history of other mental and behavioral disorders: Secondary | ICD-10-CM | POA: Insufficient documentation

## 2015-10-02 DIAGNOSIS — R188 Other ascites: Secondary | ICD-10-CM

## 2015-10-02 DIAGNOSIS — Z7952 Long term (current) use of systemic steroids: Secondary | ICD-10-CM | POA: Insufficient documentation

## 2015-10-02 DIAGNOSIS — Z79899 Other long term (current) drug therapy: Secondary | ICD-10-CM | POA: Insufficient documentation

## 2015-10-02 DIAGNOSIS — R0989 Other specified symptoms and signs involving the circulatory and respiratory systems: Secondary | ICD-10-CM | POA: Insufficient documentation

## 2015-10-02 DIAGNOSIS — R21 Rash and other nonspecific skin eruption: Secondary | ICD-10-CM | POA: Insufficient documentation

## 2015-10-02 DIAGNOSIS — Z791 Long term (current) use of non-steroidal anti-inflammatories (NSAID): Secondary | ICD-10-CM | POA: Insufficient documentation

## 2015-10-02 DIAGNOSIS — K7200 Acute and subacute hepatic failure without coma: Secondary | ICD-10-CM

## 2015-10-02 DIAGNOSIS — F17210 Nicotine dependence, cigarettes, uncomplicated: Secondary | ICD-10-CM | POA: Insufficient documentation

## 2015-10-02 DIAGNOSIS — Z8739 Personal history of other diseases of the musculoskeletal system and connective tissue: Secondary | ICD-10-CM | POA: Insufficient documentation

## 2015-10-02 DIAGNOSIS — K7040 Alcoholic hepatic failure without coma: Secondary | ICD-10-CM | POA: Insufficient documentation

## 2015-10-02 DIAGNOSIS — M7989 Other specified soft tissue disorders: Secondary | ICD-10-CM | POA: Insufficient documentation

## 2015-10-02 LAB — CBC WITH DIFFERENTIAL/PLATELET
BASOS ABS: 0 10*3/uL (ref 0.0–0.1)
Basophils Relative: 0 %
EOS PCT: 0 %
Eosinophils Absolute: 0.1 10*3/uL (ref 0.0–0.7)
HEMATOCRIT: 35.3 % — AB (ref 36.0–46.0)
HEMOGLOBIN: 11.5 g/dL — AB (ref 12.0–15.0)
LYMPHS ABS: 1 10*3/uL (ref 0.7–4.0)
LYMPHS PCT: 6 %
MCH: 36.6 pg — AB (ref 26.0–34.0)
MCHC: 32.6 g/dL (ref 30.0–36.0)
MCV: 112.4 fL — AB (ref 78.0–100.0)
Monocytes Absolute: 1.3 10*3/uL — ABNORMAL HIGH (ref 0.1–1.0)
Monocytes Relative: 9 %
NEUTROS ABS: 12.6 10*3/uL — AB (ref 1.7–7.7)
Neutrophils Relative %: 84 %
PLATELETS: 170 10*3/uL (ref 150–400)
RBC: 3.14 MIL/uL — AB (ref 3.87–5.11)
RDW: 14 % (ref 11.5–15.5)
WBC: 14.9 10*3/uL — AB (ref 4.0–10.5)

## 2015-10-02 LAB — COMPREHENSIVE METABOLIC PANEL
ALK PHOS: 197 U/L — AB (ref 38–126)
ALT: 67 U/L — AB (ref 14–54)
AST: 101 U/L — AB (ref 15–41)
Albumin: 1.9 g/dL — ABNORMAL LOW (ref 3.5–5.0)
Anion gap: 9 (ref 5–15)
BILIRUBIN TOTAL: 13.9 mg/dL — AB (ref 0.3–1.2)
BUN: 11 mg/dL (ref 6–20)
CALCIUM: 8.6 mg/dL — AB (ref 8.9–10.3)
CO2: 24 mmol/L (ref 22–32)
CREATININE: 0.59 mg/dL (ref 0.44–1.00)
Chloride: 99 mmol/L — ABNORMAL LOW (ref 101–111)
Glucose, Bld: 103 mg/dL — ABNORMAL HIGH (ref 65–99)
Potassium: 3.7 mmol/L (ref 3.5–5.1)
Sodium: 132 mmol/L — ABNORMAL LOW (ref 135–145)
Total Protein: 5.6 g/dL — ABNORMAL LOW (ref 6.5–8.1)

## 2015-10-02 LAB — PROTIME-INR
INR: 1.86 — AB (ref 0.00–1.49)
PROTHROMBIN TIME: 21.3 s — AB (ref 11.6–15.2)

## 2015-10-02 LAB — BRAIN NATRIURETIC PEPTIDE: B NATRIURETIC PEPTIDE 5: 79 pg/mL (ref 0.0–100.0)

## 2015-10-02 LAB — APTT: aPTT: 33 seconds (ref 24–37)

## 2015-10-02 LAB — MAGNESIUM: Magnesium: 1.9 mg/dL (ref 1.7–2.4)

## 2015-10-02 LAB — LIPASE, BLOOD: Lipase: 28 U/L (ref 11–51)

## 2015-10-02 MED ORDER — FUROSEMIDE 20 MG PO TABS: 20.0000 mg | ORAL_TABLET | Freq: Every day | ORAL | Status: DC

## 2015-10-02 MED ORDER — METOCLOPRAMIDE HCL 10 MG PO TABS: 10.0000 mg | ORAL_TABLET | Freq: Three times a day (TID) | ORAL | Status: AC | PRN

## 2015-10-02 MED ORDER — TRAMADOL HCL 50 MG PO TABS: 50.0000 mg | ORAL_TABLET | Freq: Four times a day (QID) | ORAL | Status: AC | PRN

## 2015-10-02 NOTE — Telephone Encounter (Signed)
Per Dr. Darrick PennaFields, please schedule this patient for a paracentesis tomorrow. No fluid analysis. 25 g Albumin after 4 liter and then 12.5 gm Albumin for each additional 2 liters removed. Please call and notify patient.  Patient scheduled to see LSL on Thursday.

## 2015-10-02 NOTE — Telephone Encounter (Signed)
Pt came to the office this morning to be seen but she called yesterday to cancel her appointment because she had another appointment. She did not know that RMR was in the same office as AS,EG. We advised her that we don;t have any appointment today and that we could see her on 10/18/15 as she is scheduled. She stated that she could not wait that long. She will be going ot the ER now since we can not see her today.

## 2015-10-02 NOTE — Telephone Encounter (Signed)
Pt is set up for US PARA on 10/03/15 @ 1045. I left a message on her phone with the date and time of the PARA.

## 2015-10-02 NOTE — ED Provider Notes (Signed)
CSN: [id number]     Arrival date & time 10/02/15  0831 History  By signing my name below, I, Kiara Warren, attest that this documentation has been prepared under the direction and in the presence of Loren Raceravid Mieko Kneebone, MD.  Electronically Signed: Gwenyth Oberatherine Warren, ED Scribe. 10/02/2015. 8:55 AM.   Chief Complaint  Patient presents with  . Jaundice  . Abdominal Pain  . Diarrhea   The history is provided by the patient. No language interpreter was used.    HPI Comments: Kiara Warren is a 49 y.o. female with a history of EtOH abuse and acute hepatic failure who presents to the Emergency Department complaining of continued jaundice and progressively worsening abdominal bloating that started 3 weeks ago. Pt reports "feeling full easily" after eating small amounts, becoming "winded" easily with movement, chills and mild lower abdominal pain as associated symptoms. She also notes diarrhea containing undigested food that started 5 hours ago and RLQ pain that started 1.5 weeks ago. Pt was last seen in the ED on 11/28 for jaundice and was admitted to the hospital for acute hepatic failure. Pt was discharged on 12/2 and advised close follow-up with GI. She came to the ED today because she missed her follow-up appointment with GI this morning. Pt denies fever,  nausea and vomiting.   GI Rourk   Past Medical History  Diagnosis Date  . ETOH abuse   . Sciatica    Past Surgical History  Procedure Laterality Date  . None     Family History  Problem Relation Age of Onset  . Cancer - Lung Father   . Cholecystitis Mother   . Diabetes Father   . Colon cancer Neg Hx    Social History  Substance Use Topics  . Smoking status: Current Every Day Smoker -- 2.00 packs/day    Types: Cigarettes  . Smokeless tobacco: None  . Alcohol Use: 0.0 oz/week    0 Standard drinks or equivalent per week     Comment: drinks about a fifth of vodka per day.    OB History    No data available     Review of  Systems  Constitutional: Positive for chills. Negative for fever.  HENT: Negative for trouble swallowing.   Respiratory: Positive for shortness of breath. Negative for cough and wheezing.   Cardiovascular: Positive for leg swelling. Negative for chest pain and palpitations.  Gastrointestinal: Positive for abdominal pain and diarrhea. Negative for nausea, vomiting, constipation and blood in stool.  Musculoskeletal: Negative for back pain, neck pain and neck stiffness.  Skin: Positive for color change and rash.  Neurological: Negative for dizziness, weakness, light-headedness, numbness and headaches.  All other systems reviewed and are negative.  Allergies  Review of patient's allergies indicates no known allergies.  Home Medications   Prior to Admission medications   Medication Sig Start Date End Date Taking? Authorizing Provider  cephALEXin (KEFLEX) 500 MG capsule Take 1 capsule (500 mg total) by mouth every 8 (eight) hours. For 4 more days 09/14/15  Yes Osvaldo ShipperGokul Krishnan, MD  Multiple Vitamin (MULTIVITAMIN WITH MINERALS) TABS tablet Take 1 tablet by mouth daily. 09/14/15  Yes Osvaldo ShipperGokul Krishnan, MD  Podiatric Products (GOLD BOND FOOT) CREA Apply 1 application topically as needed.   Yes Historical Provider, MD  prednisoLONE 5 MG TABS tablet Take 8 tablets (40 mg total) by mouth daily. 09/14/15  Yes Osvaldo ShipperGokul Krishnan, MD  thiamine 100 MG tablet Take 1 tablet (100 mg total) by mouth daily. 09/14/15  Yes  Osvaldo Shipper, MD  furosemide (LASIX) 20 MG tablet Take 1 tablet (20 mg total) by mouth daily. 10/02/15   Loren Racer, MD  metoCLOPramide (REGLAN) 10 MG tablet Take 1 tablet (10 mg total) by mouth every 8 (eight) hours as needed for nausea or vomiting. 10/02/15   Loren Racer, MD  traMADol (ULTRAM) 50 MG tablet Take 1 tablet (50 mg total) by mouth every 6 (six) hours as needed for moderate pain. 10/02/15   Loren Racer, MD   BP 115/82 mmHg  Pulse 98  Temp(Src) 97.9 F (36.6 C) (Oral)  Resp  22  Ht  (1.702 m)  Wt 182 lb (82.555 kg)  BMI 28.50 kg/m2  SpO2 99% Physical Exam  Constitutional: She is oriented to person, place, and time. She appears well-developed and well-nourished. No distress.  HENT:  Head: Normocephalic and atraumatic.  Mouth/Throat: Oropharynx is clear and moist. No oropharyngeal exudate.  Jaundice of the oral floor mucosa  Eyes: EOM are normal. Pupils are equal, round, and reactive to light. Scleral icterus is present.  Neck: Normal range of motion. Neck supple. JVD present.  Cardiovascular: Normal rate and regular rhythm.  Exam reveals no gallop and no friction rub.   No murmur heard. Pulmonary/Chest: Effort normal. No stridor. No respiratory distress. She has no wheezes. She has rales (crackles in bilateral lung bases). She exhibits no tenderness.  Abdominal: Soft. Bowel sounds are normal. She exhibits distension. She exhibits no mass. There is no tenderness. There is no rebound and no guarding.  Distended abdomen with anasarca. No tenderness with palpation, rebound or guarding.  Musculoskeletal: Normal range of motion. She exhibits no edema or tenderness.  No CVA tenderness. 4+ bilateral lower extremity pitting edema. No calf tenderness  Lymphadenopathy:    She has no cervical adenopathy.  Neurological: She is alert and oriented to person, place, and time.  5/5 motor in all extremities. Sensation is fully intact.  Skin: Skin is warm and dry. Rash noted. No erythema.  Patient has a erythematous papular rash in clusters to the dorsal surface of the upper foot and ankle bilaterally. There is mild crusting.  Psychiatric: She has a normal mood and affect. Her behavior is normal.  Nursing note and vitals reviewed.   ED Course  Procedures  DIAGNOSTIC STUDIES: Oxygen Saturation is 98% on RA, normal by my interpretation.    COORDINATION OF CARE: 8:56 AM Discussed treatment plan with pt at bedside and pt agreed to plan.  Labs Review Labs Reviewed   CBC WITH DIFFERENTIAL/PLATELET - Abnormal; Notable for the following:    WBC 14.9 (*)    RBC 3.14 (*)    Hemoglobin 11.5 (*)    HCT 35.3 (*)    MCV 112.4 (*)    MCH 36.6 (*)    Neutro Abs 12.6 (*)    Monocytes Absolute 1.3 (*)    All other components within normal limits  COMPREHENSIVE METABOLIC PANEL - Abnormal; Notable for the following:    Sodium 132 (*)    Chloride 99 (*)    Glucose, Bld 103 (*)    Calcium 8.6 (*)    Total Protein 5.6 (*)    Albumin 1.9 (*)    AST 101 (*)    ALT 67 (*)    Alkaline Phosphatase 197 (*)    Total Bilirubin 13.9 (*)    All other components within normal limits  PROTIME-INR - Abnormal; Notable for the following:    Prothrombin Time 21.3 (*)    INR  1.86 (*)    All other components within normal limits  LIPASE, BLOOD  APTT  BRAIN NATRIURETIC PEPTIDE  MAGNESIUM  URINALYSIS, ROUTINE W REFLEX MICROSCOPIC (NOT AT [facility])    Imaging Review Dg Abd Acute W/chest  10/02/2015  CLINICAL DATA:  Patient with jaundice and abdominal bloating. Diarrhea and right lower quadrant abdominal pain. EXAM: DG ABDOMEN ACUTE W/ 1V CHEST COMPARISON:  CT abdomen pelvis 09/12/2015. Chest radiograph 09/10/2015. FINDINGS: Stable cardiac and mediastinal contours. No consolidative pulmonary opacities. No pleural effusion or pneumothorax. Gas is demonstrated within nondilated loops of bowel within the central abdomen. No evidence for bowel obstruction. No free intraperitoneal air. No aggressive or acute appearing osseous lesions. IMPRESSION: No acute cardiopulmonary process. Nonobstructed bowel gas pattern. Electronically Signed   By: Annia Belt M.D.   On: 10/02/2015 09:59   I have personally reviewed and evaluated these images and lab results as part of my medical decision-making.   EKG Interpretation   Date/Time:  Tuesday October 02 2015 08:58:58 EST Ventricular Rate:  91 PR Interval:  129 QRS Duration: 102 QT Interval:  368 QTC Calculation: 453 R Axis:   25 Text  Interpretation:  Sinus rhythm Low voltage, precordial leads RSR' in  V1 or V2, right VCD or RVH Confirmed by Ranae Palms  MD, Analyn Matusek ([id number]) on  10/02/2015 10:02:05 AM      MDM   Final diagnoses:  Acute liver failure without hepatic coma  Ascites    I personally performed the services described in this documentation, which was scribed in my presence. The recorded information has been reviewed and is accurate.   Patient with obvious fluid retention. No acute respiratory distress. Vital signs stable in the emergency department. Improved liver function tests and bilirubin. Patient does not have infective signs or symptoms. Discussed with Dr.Rourk. States patient canceled her appointment yesterday and then showed up today for the appointment. She was unable to be seen there. The appointment was arranged for January 5. States he has been reviewed with his staff and will have the patient seen on December 22 at 3 PM. This is conveyed to the patient. She's been given return precautions and has voice understanding.    Loren Racer, MD 10/02/15 1130

## 2015-10-02 NOTE — ED Notes (Signed)
Recently here for same symptoms of abdominal swelling, jaundice, and ankle swelling.  Poor appetite and feeling full.

## 2015-10-02 NOTE — ED Notes (Signed)
Patient with no complaints at this time. Respirations even and unlabored. Skin warm/dry. Discharge instructions reviewed with patient at this time. Patient given opportunity to voice concerns/ask questions. IV removed per policy and band-aid applied to site. Patient discharged at this time and left Emergency Department with steady gait.  

## 2015-10-02 NOTE — Telephone Encounter (Signed)
Patient canceled her office visit today. Went to the emergency department saw Dr. Ranae PalmsYelverton. He called me. Patient appeared stable bilirubin down from 16 to 13. Asked about her follow-up which is scheduled for 10/18/2015. I discussed with the office staff and we have got hear neweexpedited appointment for December 22 at 3 PM. I conveyed to the ER doc the  patient would be seeing the physicians assistant.

## 2015-10-02 NOTE — Discharge Instructions (Signed)
Ascites Ascites is a collection of excess fluid in the abdomen. Ascites can range from mild to severe. It can get worse without treatment. CAUSES Possible causes include:  Cirrhosis. This is the most common cause of ascites.  Infection or inflammation in the abdomen.  Cancer in the abdomen.  Heart failure.  Kidney disease.  Inflammation of the pancreas.  Clots in the veins of the liver. SIGNS AND SYMPTOMS Signs and symptoms may include:  A feeling of fullness in your abdomen. This is common.  An increase in the size of your abdomen or your waist.  Swelling in your legs.  Swelling of the scrotum in men.  Difficulty breathing.  Abdominal pain.  Sudden weight gain. If the condition is mild, you may not have symptoms. DIAGNOSIS To make a diagnosis, your health care provider will:  Ask about your medical history.  Perform a physical exam.  Order imaging tests, such as an ultrasound or CT scan of your abdomen. TREATMENT Treatment depends on the cause of the ascites. It may include:  Taking a pill to make you urinate. This is called a water pill (diuretic pill).  Strictly reducing your salt (sodium) intake. Salt can cause extra fluid to be kept in the body, and this makes ascites worse.  Having a procedure to remove fluid from your abdomen (paracentesis).  Having a procedure to transfer fluid from your abdomen into a vein.  Having a procedure that connects two of the major veins within your liver and relieves pressure on your liver (TIPS procedure). Ascites may go away or improve with treatment of the condition that caused it.  HOME CARE INSTRUCTIONS  Keep track of your weight. To do this, weigh yourself at the same time every day and record your weight.  Keep track of how much you drink and any changes in the amount you urinate.  Follow any instructions that your health care provider gives you about how much to drink.  Try not to eat salty (high-sodium)  foods.  Take medicines only as directed by your health care provider.  Keep all follow-up visits as directed by your health care provider. This is important.  Report any changes in your health to your health care provider, especially if you develop new symptoms or your symptoms get worse. SEEK MEDICAL CARE IF:  Your gain more than 3 pounds in 3 days.  Your abdominal size or your waist size increases.  You have new swelling in your legs.  The swelling in your legs gets worse. SEEK IMMEDIATE MEDICAL CARE IF:  You develop a fever.  You develop confusion.  You develop new or worsening difficulty breathing.  You develop new or worsening abdominal pain.  You develop new or worsening swelling in the scrotum (in men).   This information is not intended to replace advice given to you by your health care provider. Make sure you discuss any questions you have with your health care provider.   Document Released: 09/29/2005 Document Revised: 10/20/2014 Document Reviewed: 04/28/2014 Elsevier Interactive Patient Education 2016 [employer]  Liver Failure Liver failure is a condition in which the liver loses its ability to function due to injury or disease. The liver is a large organ in the upper right-hand side of the abdomen. It is involved in many important functions, including storing energy, producing fluids that the body needs, and removing harmful substances from the bloodstream. Liver failure can develop quickly, over days or weeks (acute liver failure). It can also develop gradually, over  months or years (chronicliver failure).  CAUSES There are many possible causes of liver failure, including:  Liver infection (viral hepatitis).  Alcohol abuse.  An overdose of certain medicines. Acetaminophen overdose is a common cause.  Liver disease.  Ingesting poison from mushrooms or mold. SYMPTOMS  Symptoms of this condition may include:  Yellowing of the skin and the whites of  the eyes (jaundice).  Bruising easily.  Persistent bleeding.  Itchy skin (pruritus).  Red, spider-like lines on the skin.  Loss of appetite.  Nausea.  Vomiting blood.  Bloody bowel movements.  Weight loss.  Muscle loss.  Jerky or floppy muscle movements, especially with hand movements (asterixis).  Fluid buildup in the belly (ascites).  Decreased urination. This may be a sign that your kidneys have stopped working (renal failure).  Confusion and sleepiness.  Difficulty sleeping.  Mood and personality changes.  Frequent infections.  Breath that smells sweet or musty.  Difficulty breathing. DIAGNOSIS  This condition is diagnosed based on your symptoms, your medical history, and a physical exam. You may have tests, including:  Blood tests.  CT scan.  MRI.  Ultrasound.  Removal of a small amount of liver tissue to be examined under a microscope (biopsy). TREATMENT  Treatment depends on the cause and severity of your condition. Treatment for chronic liver failure may include:  Medicines to help reduce symptoms.  Lifestyle changes, such as limiting salt and animal proteins in your diet. Foods that contain animal proteins include red meat, fish, and dairy products. Acute or advanced (end stage) liver failure may require hospitalization. Treatment may include:  Antibiotic medicine.  IV fluids that contain sugar (glucose) and minerals (electrolytes).  Flushing out toxic substances from the body using medicine (lactulose) or a cleansing procedure (enema).  Adding certain amounts of the liquid part of blood (plasma) to your bloodstream (receiving a transfusion).  Using an artificial kidney to filter your blood (hemodialysis) if you have renal failure.  Breathing support and a breathing tube (respirator).  Liver transplant. This is a surgery to replace your liver with another person's liver (donor liver). This may be the best option if your liver has  completely stopped functioning. HOME CARE INSTRUCTIONS  Take over-the-counter and prescription medicines only as told by your health care provider.  Do not drink alcohol.  Do not use tobacco products, including cigarettes, chewing tobacco, or e-cigarettes. If you need help quitting, ask your health care provider.  Follow instructions from your health care provider about eating and drinking restrictions. This may include:  Limiting the amount of animal protein that you eat.  Increasing the amount of plant-based protein that you eat. Foods that contain plant-based proteins include whole grains, nuts, and vegetables.  Taking vitamin supplements.  Limiting the amount of salt that you eat.  Follow instructions from your health care provider about maintaining your vaccinations, especially vaccinations against hepatitis A and B.  Exercise regularly, as told by your health care provider.  Keep all follow-up visits as told by your health care provider. This is important. SEEK MEDICAL CARE IF:  You have symptoms that get worse.  You lose a lot of weight without trying.  You have a fever or chills. SEEK IMMEDIATE MEDICAL CARE IF:  You become confused or very sleepy.  You cannot take care of yourself or be taken care of at home.  You are not urinating.  You have difficulty breathing.  You vomit blood.   This information is not intended to replace advice given to  you by your health care provider. Make sure you discuss any questions you have with your health care provider.   Document Released: 06/20/2015 Document Reviewed: 10/18/2014 Elsevier Interactive Patient Education [employer]

## 2015-10-02 NOTE — Telephone Encounter (Signed)
CALLED THE PATIENT AND LEFT MESSAGE ON HER VOICE MAIL

## 2015-10-02 NOTE — Telephone Encounter (Signed)
Patient is on the schedule for Thursday, 10/04/2015  Kiara StanleyStacey, can we call the patient to make sure she has the correct date and time for her appt, please?

## 2015-10-03 ENCOUNTER — Ambulatory Visit (HOSPITAL_COMMUNITY): Admission: RE | Admit: 2015-10-03 | Payer: MEDICAID | Source: Ambulatory Visit

## 2015-10-03 ENCOUNTER — Telehealth: Payer: Self-pay | Admitting: Internal Medicine

## 2015-10-03 NOTE — Telephone Encounter (Signed)
KAREN FROM [facility] CALLED AND STATED THAT PATIENT WAS A NO SHOW FOR THE PARACENTESIS

## 2015-10-03 NOTE — Telephone Encounter (Signed)
Tried to call her tos see why she did not show and she would not answer the phone

## 2015-10-03 NOTE — Telephone Encounter (Signed)
noted 

## 2015-10-03 NOTE — Telephone Encounter (Signed)
Noted  

## 2015-10-04 ENCOUNTER — Ambulatory Visit (INDEPENDENT_AMBULATORY_CARE_PROVIDER_SITE_OTHER): Payer: Self-pay | Admitting: Gastroenterology

## 2015-10-04 ENCOUNTER — Ambulatory Visit (HOSPITAL_COMMUNITY)
Admission: RE | Admit: 2015-10-04 | Discharge: 2015-10-04 | Disposition: A | Discharge status: Home / Self Care | Payer: Self-pay | Source: Ambulatory Visit | Attending: Gastroenterology | Admitting: Gastroenterology

## 2015-10-04 ENCOUNTER — Encounter: Payer: Self-pay | Admitting: Gastroenterology

## 2015-10-04 VITALS — BP 95/65 | HR 96 | Temp 96.5°F | Ht 67.0 in | Wt 208.4 lb

## 2015-10-04 DIAGNOSIS — M25572 Pain in left ankle and joints of left foot: Secondary | ICD-10-CM | POA: Insufficient documentation

## 2015-10-04 DIAGNOSIS — R601 Generalized edema: Secondary | ICD-10-CM | POA: Insufficient documentation

## 2015-10-04 DIAGNOSIS — K7011 Alcoholic hepatitis with ascites: Secondary | ICD-10-CM | POA: Insufficient documentation

## 2015-10-04 MED ORDER — FUROSEMIDE 20 MG PO TABS: 20.0000 mg | ORAL_TABLET | Freq: Every day | ORAL | Status: AC

## 2015-10-04 MED ORDER — SPIRONOLACTONE 50 MG PO TABS: 50.0000 mg | ORAL_TABLET | Freq: Every day | ORAL | Status: AC

## 2015-10-04 MED ORDER — PREDNISOLONE 5 MG PO TABS: ORAL_TABLET | ORAL | Status: AC

## 2015-10-04 MED ORDER — OXYCODONE HCL 5 MG PO TABS: 5.0000 mg | ORAL_TABLET | Freq: Three times a day (TID) | ORAL | Status: AC | PRN

## 2015-10-04 NOTE — Progress Notes (Addendum)
Primary Care Physician: No PCP Per Patient  Primary Gastroenterologist:  Jonette EvaSandi Fields, MD    Chief Complaint  Patient presents with  . Follow-up    HPI: Kiara Warren is a 49 y.o. female with h/o chronic alcohol abuse (last consumption 09/08/15) who presents for follow up. Seen during hospitalization last month. Admitted with etoh hepatitis, anasarca. Determined to have portal vein thrombosis last admission as well. Not anticoagulated in this setting with abnormal coagulation studies and unclear if acute or chronic. Ultrasound on 09/11/2015 with limited ascites. CT abdomen and pelvis November 30 with hepatomegaly, fatty liver, low-density lesion in the right hepatic lobe just above the gallbladder indeterminate, small to moderate amount of ascites. MRI liver with hepatomegaly and severe hepatic steatosis, superimposed focal fat in the medial segment left hepatic lobe corresponding to CT abnormality, small volume of abdominal ascites.  Seen in the ER on the 20th. Some improvement in her LFTs and INR at the time. See below. We have scheduled her for possible paracentesis but she did not get the message therefore did not show for the appointment yesterday.  Patient tells me that she ran out of prednisolone around December 12. Complains of progressive swelling in her legs and abdomen. Feels extremely full. Finding hard to eat. Drinking liquids mostly. Taking Tums for heartburn. Complains of early satiety. No vomiting. Some left lower quadrant pain for about one week, mild. Bowel movements go from loose to hard. Has to strain with all of them. No blood in the stool or melena. Started on Lasix couple of days ago by the ER physician. Notes that she fell when she came back from the ER and hurt her ankle. She is having a hard time ambulating due to the pain. Feels very weak.     182 on 10/02/15 184 before 208 today.     Current Outpatient Prescriptions  Medication Sig Dispense Refill    . Cholecalciferol ([employer] VIT D 5000 HIGH-POTENCY PO) Take by mouth daily.    . furosemide (LASIX) 20 MG tablet Take 1 tablet (20 mg total) by mouth daily. 10 tablet 0  . metoCLOPramide (REGLAN) 10 MG tablet Take 1 tablet (10 mg total) by mouth every 8 (eight) hours as needed for nausea or vomiting. 30 tablet 0  . milk thistle 175 MG tablet Take 175 mg by mouth daily.    . Multiple Vitamin (MULTIVITAMIN WITH MINERALS) TABS tablet Take 1 tablet by mouth daily. 30 tablet 3  . Podiatric Products (GOLD BOND FOOT) CREA Apply 1 application topically as needed.    . thiamine 100 MG tablet Take 1 tablet (100 mg total) by mouth daily. 30 tablet 3  . traMADol (ULTRAM) 50 MG tablet Take 1 tablet (50 mg total) by mouth every 6 (six) hours as needed for moderate pain. 15 tablet 0  . vitamin C (ASCORBIC ACID) 500 MG tablet Take 500 mg by mouth daily.    . prednisoLONE 5 MG TABS tablet Take 8 tablets (40 mg total) by mouth daily. (Patient not taking: Reported on 10/04/2015) 90 each 0   No current facility-administered medications for this visit.    Allergies as of 10/04/2015  . (No Known Allergies)    ROS:  General:no fever. See hpi ENT: Negative for hoarseness, difficulty swallowing , nasal congestion. CV: Negative for chest pain, angina, palpitations, dyspnea on exertion, peripheral edema.  Respiratory: Negative for dyspnea at rest, dyspnea on exertion, cough, sputum, wheezing.  GI: See history of present illness.  GU:  Negative for dysuria, hematuria, urinary incontinence, urinary frequency, nocturnal urination.  Endo: Negative for unusual weight change.    Physical Examination:   BP 95/65 mmHg  Pulse 96  Temp(Src) 96.5 F (35.8 C)  Ht  (1.702 m)  Wt 208 lb 6.4 oz (94.53 kg)  BMI 32.63 kg/m2  General: jaundiced young female in NAD. Accompanied by her mother.   Eyes: +icterus. Mouth: Oropharyngeal mucosa moist and pink , no lesions erythema or exudate. Lungs: Clear to auscultation  bilaterally.  Heart: Regular rate and rhythm, no murmurs rubs or gallops.  Abdomen: Bowel sounds are normal, moderately distended, +fluid wave, nontender, no abdominal bruits or hernia , no rebound or guarding.   Extremities: 3+ pitting edema to knees, 2 + to thighs bilaterally. Weeping from skin on anterior feet. No clubbing or deformities. Neuro: Alert and oriented x 4   Skin: Warm and dry, no jaundice.   Psych: Alert and cooperative, normal mood and affect.  Labs:  Lab Results  Component Value Date   WBC 14.9* 10/02/2015   HGB 11.5* 10/02/2015   HCT 35.3* 10/02/2015   MCV 112.4* 10/02/2015   PLT 170 10/02/2015   Lab Results  Component Value Date   CREATININE 0.59 10/02/2015   BUN 11 10/02/2015   NA 132* 10/02/2015   K 3.7 10/02/2015   CL 99* 10/02/2015   CO2 24 10/02/2015   Lab Results  Component Value Date   ALT 67* 10/02/2015   AST 101* 10/02/2015   ALKPHOS 197* 10/02/2015   BILITOT 13.9* 10/02/2015   Lab Results  Component Value Date   INR 1.86* 10/02/2015   INR 2.25* 09/13/2015   INR 2.11* 09/12/2015   Lab Results  Component Value Date   LIPASE 28 10/02/2015    Imaging Studies: Dg Chest 2 View  09/10/2015  CLINICAL DATA:  49 year old female with jaundice and abdominal pain and swelling. EXAM: CHEST  2 VIEW COMPARISON:  None. FINDINGS: The cardiomediastinal silhouette is unremarkable. There is no evidence of focal airspace disease, pulmonary edema, suspicious pulmonary nodule/mass, pleural effusion, or pneumothorax. No acute bony abnormalities are identified. IMPRESSION: No active cardiopulmonary disease. Electronically Signed   By: Harmon Pier M.D.   On: 09/10/2015 18:46   Ct Abdomen Pelvis W Contrast  09/12/2015  CLINICAL DATA:  Jaundice starting 09/07/2015, upper abdominal pain and swelling EXAM: CT ABDOMEN AND PELVIS WITH CONTRAST TECHNIQUE: Multidetector CT imaging of the abdomen and pelvis was performed using the standard protocol following bolus  administration of intravenous contrast. CONTRAST:  85mL OMNIPAQUE IOHEXOL 350 MG/ML SOLN COMPARISON:  Abdominal ultrasound 09/11/2015 and CT scan 04/18/2008 FINDINGS: Sagittal images of the spine shows mild degenerative changes lower thoracic spine. Heart size within normal limits.  Small hiatal hernia is noted. Hepatomegaly is noted. The liver measures 28 cm in length. There is significant fatty infiltration of the liver. Perihepatic ascites is noted. Small perisplenic ascites. There is poorly visualized low-density lesion in right hepatic lobe just above the gallbladder measures 2.3 cm. This is indeterminate. Further correlation with enhanced MRI with delay images is recommended. The pancreas, spleen and adrenal glands are unremarkable. Kidneys are symmetrical in size and enhancement. No hydronephrosis or hydroureter. Portal vein measures 1.1 cm in diameter. No evidence of portal hypertension. There is small ascites bilateral paracolic gutter right greater than left. Moderate pelvic ascites is noted. No aortic aneurysm. Delayed renal images shows bilateral renal symmetrical excretion. Oral contrast material was given to the patient. No small bowel obstruction.  No free abdominal air. There is no pericecal inflammation. The terminal ileum is unremarkable.  The appendix is not identified. The uterus and adnexa are unremarkable. No adnexal mass. Small amount of air is noted within anterior superior aspect of the urinary bladder probable post instrumentation. No destructive bony lesions are noted within pelvis. Mild pericholecystic fluid. Mild enhancement of gallbladder wall. Although this may be due to ascites cholecystitis cannot be excluded. Clinical correlation is necessary. No calcified gallstones are noted within gallbladder. No aortic aneurysm. IMPRESSION: 1. Small hiatal hernia is noted. 2. There is hepatomegaly. Significant fatty infiltration of the liver. Indeterminate low-density lesion in right hepatic lobe  just above the gallbladder measures 2.3 cm. Further correlation with enhanced MRI is recommended. 3. There is perihepatic and perisplenic ascites. Ascites is noted bilateral paracolic gutters right greater than left. Moderate pelvic ascites. No free abdominal air. 4. No small bowel obstruction. 5. There is mild enhancement of the gallbladder wall. Mild pericholecystic fluid. Although this may be due to ascites cholecystitis cannot be excluded. Clinical correlation is necessary. 6. No colonic obstruction. Mild degenerative changes thoracolumbar spine. Electronically Signed   By: Natasha Mead M.D.   On: 09/12/2015 12:22   Mr Liver W Wo Contrast  09/13/2015  CLINICAL DATA:  Upper abdominal pain, nausea, jaundice. Evaluate liver lesion on CT. EXAM: MRI ABDOMEN WITHOUT AND WITH CONTRAST TECHNIQUE: Multiplanar multisequence MR imaging of the abdomen was performed both before and after the administration of intravenous contrast. CONTRAST:  8 mL Eovist IV COMPARISON:  CT abdomen pelvis dated 09/12/2015 FINDINGS: Motion degraded images. Lower chest:  Trace bilateral pleural effusions. Hepatobiliary: Hepatomegaly. Heterogeneous/geographic areas of severe hepatic steatosis. 2.1 x 1.5 cm hypoenhancing lesion along the posterior aspect of the medial segment left hepatic lobe (series 5005/image 43), corresponding to the CT abnormality, with severe signal loss on opposed phase imaging (series 6/image 32), reflecting an area of superimposed focal fat. No suspicious/enhancing hepatic lesions. Mild pericholecystic fluid, favored to be secondary to upper abdominal ascites. No gallbladder wall thickening or gallbladder distention. No intrahepatic or extrahepatic ductal dilatation. No definite cholelithiasis or choledocholithiasis. Pancreas: Within normal limits. Spleen: Within normal limits. Adrenals/Urinary Tract: Adrenal glands are within normal limits. Kidneys within normal limits.  No hydronephrosis. Stomach/Bowel: Stomach is  within normal limits. Visualized bowel is unremarkable. Vascular/Lymphatic: No evidence abdominal aortic aneurysm. No suspicious abdominal lymphadenopathy. Other: Small volume abdominal ascites, most prominent along the liver and bilateral pericolic gutters. Musculoskeletal: No focal osseous lesions. IMPRESSION: Hepatomegaly with severe hepatic steatosis. In the setting of abnormal LFTs, correlate for steatohepatitis. Superimposed focal fat in the medial segment left hepatic lobe, corresponding to the CT abnormality. No suspicious/enhancing hepatic lesions. No cholelithiasis or choledocholithiasis is visualized. No intrahepatic or extrahepatic ductal dilatation. Small volume upper abdominal ascites. Trace bilateral pleural effusions. Electronically Signed   By: Charline Bills M.D.   On: 09/13/2015 12:45   [place] Art/ven Flow Abd Pelv Doppler Limited  09/11/2015  CLINICAL DATA:  Possible portal vein thrombus. EXAM: DUPLEX ULTRASOUND OF LIVER AND TIPS SHUNT TECHNIQUE: Color and duplex Doppler ultrasound was performed to evaluate the hepatic in-flow vessels. COMPARISON:  Ultrasound of same day. FINDINGS: Hepatic artery is patent with antegrade flow with peak systolic velocity of 168 centimeters/second. There appears to be occlusion of the proximal and middle portions of the portal vein consistent with thrombosis of indeterminate age. Retrograde flow is noted in the distal portion of the portal vein which flows into the proximal left portal vein. 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 These results will be called to the ordering clinician or representative by the Radiologist Assistant, and communication documented in the PACS or zVision Dashboard. Electronically Signed   By: Lupita Raider, M.D.   On: 09/11/2015 14:42   Dg Abd Acute W/chest  10/02/2015  CLINICAL DATA:  Patient with jaundice and abdominal bloating. Diarrhea and right lower  quadrant abdominal pain. EXAM: DG ABDOMEN ACUTE W/ 1V CHEST COMPARISON:  CT abdomen pelvis 09/12/2015. Chest radiograph 09/10/2015. FINDINGS: Stable cardiac and mediastinal contours. No consolidative pulmonary opacities. No pleural effusion or pneumothorax. Gas is demonstrated within nondilated loops of bowel within the central abdomen. No evidence for bowel obstruction. No free intraperitoneal air. No aggressive or acute appearing osseous lesions. IMPRESSION: No acute cardiopulmonary process. Nonobstructed bowel gas pattern. Electronically Signed   By: Annia Belt M.D.   On: 10/02/2015 09:59   US Abdomen Limited Ruq  09/11/2015  CLINICAL DATA:  Hepatic failure with jaundice. EXAM: US ABDOMEN LIMITED - RIGHT UPPER QUADRANT COMPARISON:  None. FINDINGS: Gallbladder: Sludge is seen in the gallbladder. No gallstones are appreciable. The gallbladder wall is thickened and edematous. There is no well-defined pericholecystic fluid. No sonographic Murphy sign noted. Common bile duct: Diameter: 3 mm. There is no intrahepatic or extrahepatic biliary duct dilatation. Liver: No focal lesion identified. Liver is diffusely echogenic. There is attenuated flow in the portal vein with subtle echogenic material in the main portal vein. A small amount of ascites is noted. IMPRESSION: The wall of the gallbladder is thickened and edematous. While ascites, which is present, can cause gallbladder wall thickening, the edematous appearance of the gallbladder raises concern for acalculus cholecystitis. No gallstones are seen; however, sludge is noted in the gallbladder. Liver shows diffuse increased echogenicity consistent with hepatic steatosis, likely with underlying parenchymal liver disease given the history. Question portal vein thrombosis. Doppler signal is attenuated in the main portal vein with subtle echogenic material concerning for thrombus in this vessel. Small amount of ascites. Electronically Signed   By: Bretta Bang  III M.D.   On: 09/11/2015 08:51    Impression/Plan:  49 y/o female with etoh hepatitis, anasarca. Her LFTs have improved slightly. Unfortunately she is no longer on prednisone, patient with poor understanding of her medical management. Was only given 10 day supply when she was discharged from the hospital as well. She has developed worsening anasarca. Her weight is actually quite a bit from 182 2 days ago to 208 today. Weight was documented twice today. Inconsistently in the low 180s back in late November.  She has moderate ascites clinically on exam. We'll send her for possible paracentesis if she has significant ascites. Orders already placed previously, we will reschedule. Also sent for an x-ray of her left foot to rule out fracture given inability to ambulate. Patient given oxycodone 5 mg to take every 8 hours as needed, limited supply, advised not to take with tramadol. We will recheck her labs next week. In the interim start low-dose Lasix and Aldactone. Restart prednisolone. Return to the office in 2 weeks. Congratulated on alcohol cessation.  Leanna Battles. [facility] [phone] 12/22/20165:11 PM

## 2015-10-04 NOTE — Patient Instructions (Addendum)
1. Please go for x-ray of your ankle today. 2. Please have your blood work done next week. 3. Continue Lasix 20 mg daily, add Aldactone 50 mg daily to help with your swelling. 4. Restart prednisolone 40 mg daily for 3 weeks, then 30 mg daily for 5 days, then 20 mg daily for 5 days, then 10 mg daily for 5 days then stop. 5. Limit pain medication. Do not take new RX, oxycodone with the tramadol. 6. Return to the office in 2 weeks.

## 2015-10-09 NOTE — Progress Notes (Signed)
NO PCP PER PATIENT °

## 2015-10-10 ENCOUNTER — Ambulatory Visit (HOSPITAL_COMMUNITY): Admission: RE | Admit: 2015-10-10 | Payer: Self-pay | Source: Ambulatory Visit

## 2015-10-10 ENCOUNTER — Ambulatory Visit (HOSPITAL_COMMUNITY): Payer: Self-pay

## 2015-10-10 ENCOUNTER — Ambulatory Visit (HOSPITAL_COMMUNITY)
Admission: RE | Admit: 2015-10-10 | Discharge: 2015-10-10 | Disposition: A | Discharge status: Home / Self Care | Payer: Self-pay | Source: Ambulatory Visit | Attending: Gastroenterology | Admitting: Gastroenterology

## 2015-10-10 ENCOUNTER — Other Ambulatory Visit (HOSPITAL_COMMUNITY)
Admission: RE | Admit: 2015-10-10 | Discharge: 2015-10-10 | Disposition: A | Discharge status: Home / Self Care | Payer: Self-pay | Source: Ambulatory Visit | Attending: Gastroenterology | Admitting: Gastroenterology

## 2015-10-10 DIAGNOSIS — R601 Generalized edema: Secondary | ICD-10-CM | POA: Insufficient documentation

## 2015-10-10 DIAGNOSIS — R188 Other ascites: Secondary | ICD-10-CM | POA: Insufficient documentation

## 2015-10-10 DIAGNOSIS — K7011 Alcoholic hepatitis with ascites: Secondary | ICD-10-CM | POA: Insufficient documentation

## 2015-10-10 LAB — COMPREHENSIVE METABOLIC PANEL
ALBUMIN: 2.2 g/dL — AB (ref 3.5–5.0)
ALK PHOS: 241 U/L — AB (ref 38–126)
ALT: 72 U/L — AB (ref 14–54)
AST: 110 U/L — AB (ref 15–41)
Anion gap: 7 (ref 5–15)
BUN: 19 mg/dL (ref 6–20)
CALCIUM: 9.3 mg/dL (ref 8.9–10.3)
CO2: 26 mmol/L (ref 22–32)
CREATININE: 0.69 mg/dL (ref 0.44–1.00)
Chloride: 99 mmol/L — ABNORMAL LOW (ref 101–111)
GFR calc Af Amer: >60 mL/min (ref >60)
GFR calc non Af Amer: >60 mL/min (ref >60)
GLUCOSE: 98 mg/dL (ref 65–99)
Potassium: 4.1 mmol/L (ref 3.5–5.1)
SODIUM: 132 mmol/L — AB (ref 135–145)
Total Bilirubin: 8.5 mg/dL — ABNORMAL HIGH (ref 0.3–1.2)
Total Protein: 6.8 g/dL (ref 6.5–8.1)

## 2015-10-10 LAB — CBC WITH DIFFERENTIAL/PLATELET
BASOS PCT: 0 %
Basophils Absolute: 0 10*3/uL (ref 0.0–0.1)
EOS ABS: 0 10*3/uL (ref 0.0–0.7)
Eosinophils Relative: 0 %
HEMATOCRIT: 41.8 % (ref 36.0–46.0)
HEMOGLOBIN: 13.7 g/dL (ref 12.0–15.0)
LYMPHS ABS: 1.9 10*3/uL (ref 0.7–4.0)
Lymphocytes Relative: 11 %
MCH: 35.6 pg — ABNORMAL HIGH (ref 26.0–34.0)
MCHC: 32.8 g/dL (ref 30.0–36.0)
MCV: 108.6 fL — ABNORMAL HIGH (ref 78.0–100.0)
MONO ABS: 0.5 10*3/uL (ref 0.1–1.0)
Monocytes Relative: 3 %
NEUTROS ABS: 15.3 10*3/uL — AB (ref 1.7–7.7)
NEUTROS PCT: 86 %
Platelets: 79 10*3/uL — ABNORMAL LOW (ref 150–400)
RBC: 3.85 MIL/uL — ABNORMAL LOW (ref 3.87–5.11)
RDW: 13.2 % (ref 11.5–15.5)
WBC: 17.7 10*3/uL — ABNORMAL HIGH (ref 4.0–10.5)

## 2015-10-10 LAB — PROTIME-INR
INR: 1.73 — ABNORMAL HIGH (ref 0.00–1.49)
Prothrombin Time: 20.2 seconds — ABNORMAL HIGH (ref 11.6–15.2)

## 2015-10-10 NOTE — Progress Notes (Signed)
Quick Note:  Please let patient know her ankle xray showed no fracture. ______

## 2015-10-10 NOTE — Procedures (Signed)
PreOperative Dx: Ethanol abuse, ascites Postoperative Dx: Ethanol abuse, ascites Procedure:   US guided paracentesis Radiologist:  Tyron RussellBoles Anesthesia:  10 ml of 1% lidocaine Specimen:  3400 ml of bright yellow ascitic fluid EBL:   < 1 ml Complications: None

## 2015-10-10 NOTE — Progress Notes (Signed)
Quick Note:  Total bilirubin down from 13.9 to 8.5. No improvement in AST/ALT/AP. Her WBC is up. -->Is she on Prednisolone as prescribed last week? This could cause WBC to go up. -->Continue aldactone/lasix/prednisolone. -->Is she having any abdominal pain? She had LVAP today, no fluid sent for analysis per Wynne DustEric Gill, NP order. -->Keep upcoming ov. ______

## 2015-10-11 NOTE — Progress Notes (Signed)
Quick Note:  LMOM to call. ______ 

## 2015-10-16 ENCOUNTER — Telehealth: Payer: Self-pay | Admitting: Gastroenterology

## 2015-10-16 NOTE — Progress Notes (Signed)
Quick Note:  Letter mailed that ankle x-ray showed no fracture. ______

## 2015-10-16 NOTE — Progress Notes (Signed)
Quick Note:  Letter mailed to pt to call to discuss. ______

## 2015-10-16 NOTE — Progress Notes (Signed)
Quick Note:  LMOM to call. ______ 

## 2015-10-16 NOTE — Telephone Encounter (Signed)
Kiara Warren please touch base with patient.   I'm not sure why she is asking for refills on fluid pills. I gave her six months worth on 10/04/15 of both lasix and aldactone.  We need to verify that she is taking prednisolone as well.   No RX for tramadol planned for "sleep". She needs to see PCP about insomnia.   What kind of letter does she need for court???  She needs to keep OV as planned.

## 2015-10-16 NOTE — Telephone Encounter (Signed)
Pt called this afternoon saying that DS had called her and she wanted to speak with LSL instead. I told her that I would have to send a phone note that LSL was not available to come to the phone. Patient is wanting refills on her fluid pills because she is swollen and also needs tramadol to help her sleep. She said she is 2 hours away and uses [facility] and the number she gave me was [phone].  She is aware that she has OV on 10/23/15 and asked if LSL would write something up for her to take to court about her having liver failure and is being treated. Please advise and call her at [phone]

## 2015-10-17 NOTE — Telephone Encounter (Signed)
Pt called and said she does not need refills on her fluid pills, she just wanted to know if Tana CoastLeslie Lewis, PA would like for her to increase the dosage.  Said she had fluid drawn off last week and was down to 198 lbs and now last night she was at 202.1 lbs.  She is taking the Lasix 20 mg qd and Spironolactone 50 mg qd.   She is taking the Prednisolone.  She asked about the Tramadol for sleep and I told her that Verlon AuLeslie said she would need to see her PCP to get something for insomnia.   She is aware of her appt next week and is aware she needs to keep the appt.  She needs Verlon AuLeslie to write a letter to her lawyer, Boyd KerbsScott Skidmore and fax to [phone].  The letter needs to state all of her medical problems, liver problems, blood clots, can't walk, etc.  She has a court day soon, and needs this as soon as possible.

## 2015-10-17 NOTE — Telephone Encounter (Signed)
I called and went over the instructions with pt. She seemed to understand some, but said she will get her mother to call me and go over the instructions also.  She is aware we have faxed the letter to the attorney, Boyd KerbsScott Skidmore.

## 2015-10-17 NOTE — Telephone Encounter (Signed)
Fax did not go through x 2. I called the office and we have the correct fax number. Will try again Thursday morning.

## 2015-10-17 NOTE — Telephone Encounter (Signed)
Can increase her lasix to 40mg  daily and spironolactone to 100mg  daily. Continue prednisolone.  We will recheck her labs at next OV to make sure her sodium level and kidney function ok on increased diuretics.   Letter written with basic medical information provided as patient does not have PCP and given that she is not disclosing what kind of court case she is going to. ?disability?

## 2015-10-18 ENCOUNTER — Ambulatory Visit: Payer: Self-pay | Admitting: Gastroenterology

## 2015-10-18 NOTE — Telephone Encounter (Signed)
Faxed the letter and it did not go through. I will try a little later.

## 2015-10-18 NOTE — Telephone Encounter (Signed)
Faxed the letter again to the lawyer and received a successful fax notice.

## 2015-10-23 ENCOUNTER — Ambulatory Visit: Payer: Self-pay | Admitting: Gastroenterology

## 2016-01-12 DEATH — deceased

## 2016-03-27 IMAGING — US US PARACENTESIS
1 series · 2 of 2 positions shown · non-contrast
Comparison: CT abdomen and pelvis 09/12/2015

CLINICAL DATA: Ethanol abuse, ascites

EXAM:
ULTRASOUND GUIDED THERAPEUTIC PARACENTESIS

[Series 1: us paracentesis · 0.16mm/px · 2 of 2 slices shown]
[im 1/2]
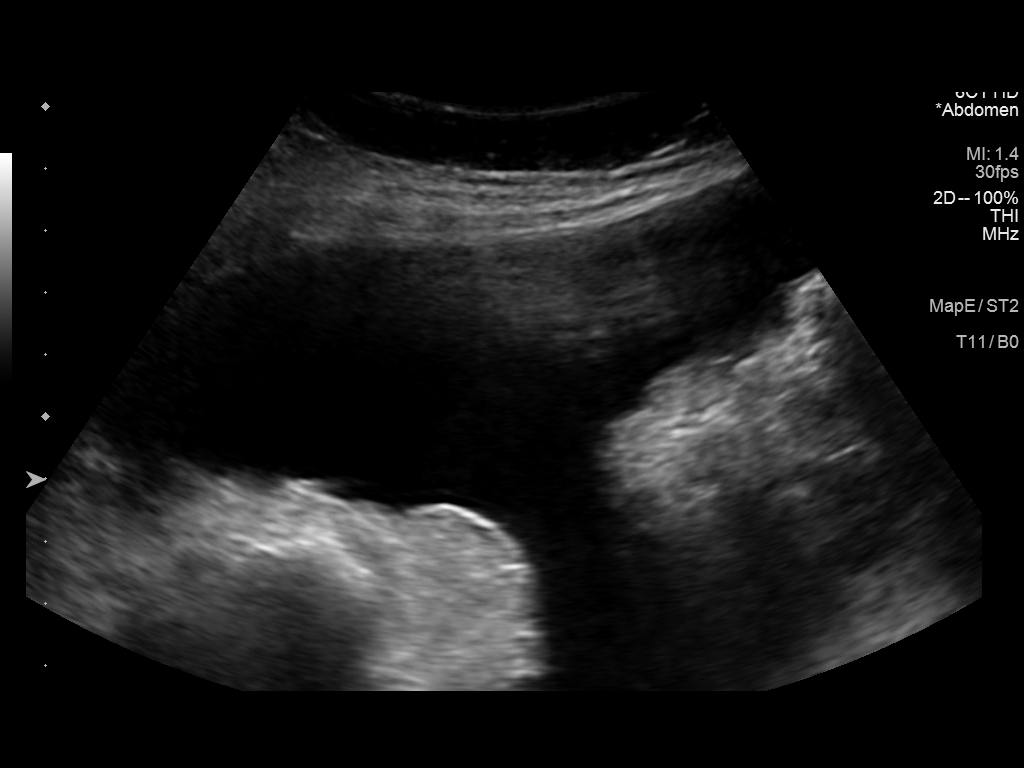
[im 2/2]
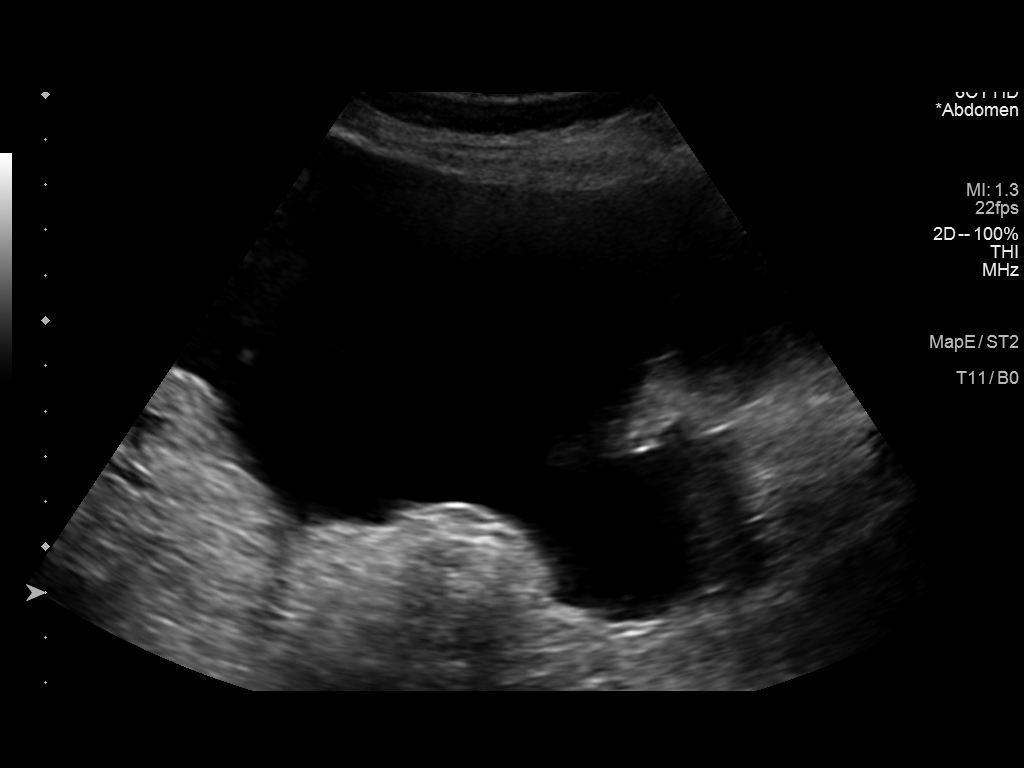

[2 of 2 positions shown; findings below may reference images not displayed]

PROCEDURE:
Procedure, benefits, and risks of procedure were discussed with
patient.

Written informed consent for procedure was obtained.

Time out protocol followed.

Adequate collection of ascites localized by ultrasound in RIGHT
lower quadrant.

Skin prepped and draped in usual sterile fashion.

Skin and soft tissues anesthetized with 10 mL of 1% lidocaine.

5 French Yueh catheter placed into peritoneal cavity.

5866 mL of bright yellow colored ascites aspirated by vacuum bottle
suction.

Procedure tolerated well by patient without immediate complication.

COMPLICATIONS:
None.
FINDINGS: As above
IMPRESSION: Successful first time ultrasound guided paracentesis yielding 5866
mL of ascites.
